# Patient Record
Sex: Female | Born: 1935 | Race: White | Hispanic: No | Marital: Married | Smoking: Never smoker
Health system: Southern US, Community
[De-identification: ages and names within clinical notes are randomized; demographics above are authoritative.]

## PROBLEM LIST (undated history)

## (undated) DIAGNOSIS — IMO0002 Reserved for concepts with insufficient information to code with codable children: Secondary | ICD-10-CM

## (undated) DIAGNOSIS — G43909 Migraine, unspecified, not intractable, without status migrainosus: Secondary | ICD-10-CM

## (undated) DIAGNOSIS — M87059 Idiopathic aseptic necrosis of unspecified femur: Secondary | ICD-10-CM

## (undated) DIAGNOSIS — N179 Acute kidney failure, unspecified: Secondary | ICD-10-CM

## (undated) DIAGNOSIS — I10 Essential (primary) hypertension: Secondary | ICD-10-CM

## (undated) DIAGNOSIS — M1611 Unilateral primary osteoarthritis, right hip: Secondary | ICD-10-CM

## (undated) DIAGNOSIS — M6282 Rhabdomyolysis: Secondary | ICD-10-CM

## (undated) DIAGNOSIS — E7800 Pure hypercholesterolemia, unspecified: Secondary | ICD-10-CM

## (undated) DIAGNOSIS — R269 Unspecified abnormalities of gait and mobility: Secondary | ICD-10-CM

## (undated) DIAGNOSIS — I509 Heart failure, unspecified: Secondary | ICD-10-CM

## (undated) DIAGNOSIS — D696 Thrombocytopenia, unspecified: Secondary | ICD-10-CM

## (undated) DIAGNOSIS — N390 Urinary tract infection, site not specified: Secondary | ICD-10-CM

## (undated) HISTORY — PX: BACK SURGERY: SHX140

## (undated) HISTORY — PX: ABDOMINAL HYSTERECTOMY: SHX81

## (undated) HISTORY — DX: Idiopathic aseptic necrosis of unspecified femur: M87.059

## (undated) HISTORY — DX: Reserved for concepts with insufficient information to code with codable children: IMO0002

## (undated) HISTORY — DX: Unspecified abnormalities of gait and mobility: R26.9

## (undated) HISTORY — DX: Urinary tract infection, site not specified: N39.0

## (undated) HISTORY — DX: Heart failure, unspecified: I50.9

## (undated) HISTORY — PX: VULVECTOMY: SHX1086

## (undated) HISTORY — PX: OTHER SURGICAL HISTORY: SHX169

## (undated) HISTORY — DX: Migraine, unspecified, not intractable, without status migrainosus: G43.909

## (undated) HISTORY — PX: CHOLECYSTECTOMY: SHX55

---

## 1997-10-06 ENCOUNTER — Ambulatory Visit (HOSPITAL_COMMUNITY): Admission: RE | Admit: 1997-10-06 | Discharge: 1997-10-06 | Payer: Self-pay | Admitting: Orthopedic Surgery

## 1997-10-28 ENCOUNTER — Inpatient Hospital Stay (HOSPITAL_COMMUNITY): Admission: RE | Admit: 1997-10-28 | Discharge: 1997-10-30 | Payer: Self-pay | Admitting: Orthopedic Surgery

## 1998-01-17 ENCOUNTER — Encounter: Payer: Self-pay | Admitting: Family Medicine

## 1998-01-17 ENCOUNTER — Ambulatory Visit (HOSPITAL_COMMUNITY): Admission: RE | Admit: 1998-01-17 | Discharge: 1998-01-17 | Payer: Self-pay | Admitting: Family Medicine

## 1998-11-17 ENCOUNTER — Emergency Department (HOSPITAL_COMMUNITY): Admission: EM | Admit: 1998-11-17 | Discharge: 1998-11-18 | Payer: Self-pay | Admitting: Emergency Medicine

## 1998-11-17 ENCOUNTER — Encounter: Payer: Self-pay | Admitting: Emergency Medicine

## 1998-12-31 ENCOUNTER — Encounter: Payer: Self-pay | Admitting: Specialist

## 1998-12-31 ENCOUNTER — Encounter: Admission: RE | Admit: 1998-12-31 | Discharge: 1998-12-31 | Payer: Self-pay | Admitting: Specialist

## 1999-02-15 ENCOUNTER — Encounter: Payer: Self-pay | Admitting: Family Medicine

## 1999-02-15 ENCOUNTER — Ambulatory Visit (HOSPITAL_COMMUNITY): Admission: RE | Admit: 1999-02-15 | Discharge: 1999-02-15 | Payer: Self-pay | Admitting: Family Medicine

## 2000-02-16 ENCOUNTER — Ambulatory Visit (HOSPITAL_COMMUNITY): Admission: RE | Admit: 2000-02-16 | Discharge: 2000-02-16 | Payer: Self-pay | Admitting: Family Medicine

## 2000-02-16 ENCOUNTER — Encounter: Payer: Self-pay | Admitting: Family Medicine

## 2001-10-30 ENCOUNTER — Encounter: Payer: Self-pay | Admitting: Family Medicine

## 2001-10-30 ENCOUNTER — Ambulatory Visit (HOSPITAL_COMMUNITY): Admission: RE | Admit: 2001-10-30 | Discharge: 2001-10-30 | Payer: Self-pay | Admitting: Family Medicine

## 2002-01-28 ENCOUNTER — Encounter: Payer: Self-pay | Admitting: Orthopedic Surgery

## 2002-02-03 ENCOUNTER — Encounter: Payer: Self-pay | Admitting: Orthopedic Surgery

## 2002-02-03 ENCOUNTER — Inpatient Hospital Stay (HOSPITAL_COMMUNITY): Admission: RE | Admit: 2002-02-03 | Discharge: 2002-02-06 | Payer: Self-pay | Admitting: Orthopedic Surgery

## 2002-02-06 ENCOUNTER — Inpatient Hospital Stay (HOSPITAL_COMMUNITY)
Admission: RE | Admit: 2002-02-06 | Discharge: 2002-02-16 | Payer: Self-pay | Admitting: Physical Medicine & Rehabilitation

## 2002-02-10 ENCOUNTER — Encounter: Payer: Self-pay | Admitting: Physical Medicine & Rehabilitation

## 2002-02-13 ENCOUNTER — Encounter: Payer: Self-pay | Admitting: Physical Medicine & Rehabilitation

## 2002-02-15 ENCOUNTER — Encounter: Payer: Self-pay | Admitting: Physical Medicine & Rehabilitation

## 2002-05-14 ENCOUNTER — Encounter: Payer: Self-pay | Admitting: Orthopedic Surgery

## 2002-05-20 ENCOUNTER — Inpatient Hospital Stay (HOSPITAL_COMMUNITY): Admission: RE | Admit: 2002-05-20 | Discharge: 2002-05-27 | Payer: Self-pay | Admitting: Orthopedic Surgery

## 2002-05-20 ENCOUNTER — Encounter: Payer: Self-pay | Admitting: Orthopedic Surgery

## 2002-05-27 ENCOUNTER — Inpatient Hospital Stay (HOSPITAL_COMMUNITY)
Admission: RE | Admit: 2002-05-27 | Discharge: 2002-06-05 | Payer: Self-pay | Admitting: Physical Medicine & Rehabilitation

## 2002-06-03 ENCOUNTER — Encounter: Payer: Self-pay | Admitting: Physical Medicine & Rehabilitation

## 2002-11-05 ENCOUNTER — Ambulatory Visit (HOSPITAL_COMMUNITY): Admission: RE | Admit: 2002-11-05 | Discharge: 2002-11-05 | Payer: Self-pay | Admitting: Obstetrics & Gynecology

## 2002-11-05 ENCOUNTER — Encounter: Payer: Self-pay | Admitting: Obstetrics & Gynecology

## 2003-01-18 ENCOUNTER — Encounter: Admission: RE | Admit: 2003-01-18 | Discharge: 2003-01-18 | Payer: Self-pay | Admitting: Cardiology

## 2003-01-25 ENCOUNTER — Ambulatory Visit (HOSPITAL_COMMUNITY): Admission: RE | Admit: 2003-01-25 | Discharge: 2003-01-26 | Payer: Self-pay | Admitting: Cardiology

## 2003-02-24 ENCOUNTER — Ambulatory Visit (HOSPITAL_COMMUNITY): Admission: RE | Admit: 2003-02-24 | Discharge: 2003-02-24 | Payer: Self-pay | Admitting: Cardiology

## 2003-03-15 ENCOUNTER — Ambulatory Visit (HOSPITAL_COMMUNITY): Admission: RE | Admit: 2003-03-15 | Discharge: 2003-03-15 | Payer: Self-pay | Admitting: Pulmonary Disease

## 2003-11-11 ENCOUNTER — Ambulatory Visit (HOSPITAL_COMMUNITY): Admission: RE | Admit: 2003-11-11 | Discharge: 2003-11-11 | Payer: Self-pay | Admitting: Family Medicine

## 2003-12-30 ENCOUNTER — Other Ambulatory Visit: Admission: RE | Admit: 2003-12-30 | Discharge: 2003-12-30 | Payer: Self-pay | Admitting: Obstetrics and Gynecology

## 2004-04-14 ENCOUNTER — Ambulatory Visit (HOSPITAL_COMMUNITY): Admission: RE | Admit: 2004-04-14 | Discharge: 2004-04-14 | Payer: Self-pay | Admitting: Gastroenterology

## 2004-04-14 IMAGING — CR DG BE W/ CM - WO/W KUB
8 of 10 series · 8 of 10 positions shown · non-contrast
Comparison: none

CLINICAL DATA: Change in bowel habits; family history of   carcinoma.  Incomplete colonoscopy.
 BARIUM ENEMA:
 With the aide of fluoroscopic visualization, barium was passed retrogradely throughout the colon with reflux of the normal terminal ileum.   The cecum appeared normal as did the remainder of the bowel.   There were no constricting defects or large filling defects.   There was a moderate amount of gas in the bowel from previous colonoscopic exam.   
 Post evacuation study showed good evacuation of the colon and revealed normal mucosal pattern throughout the colon.

[view not recorded (1 of 8)]
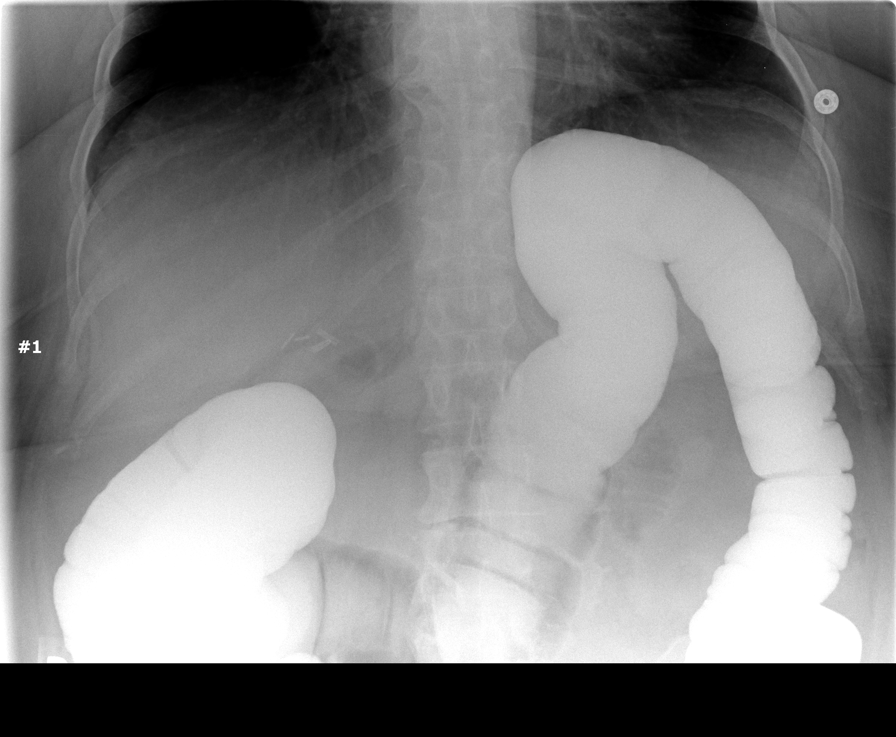

[view not recorded (2 of 8)]
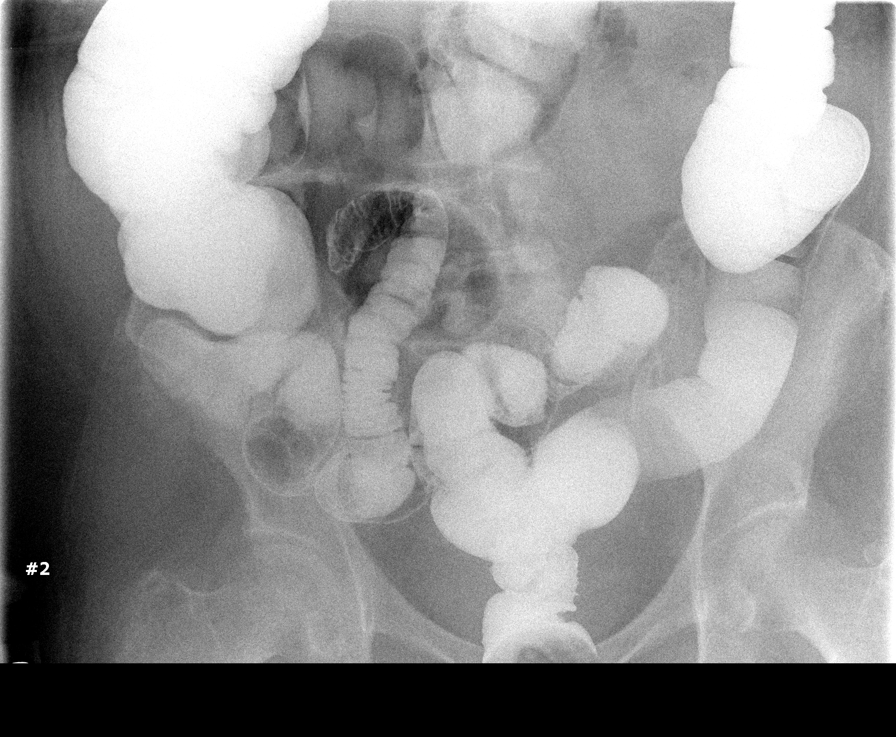

[view not recorded (3 of 8)]
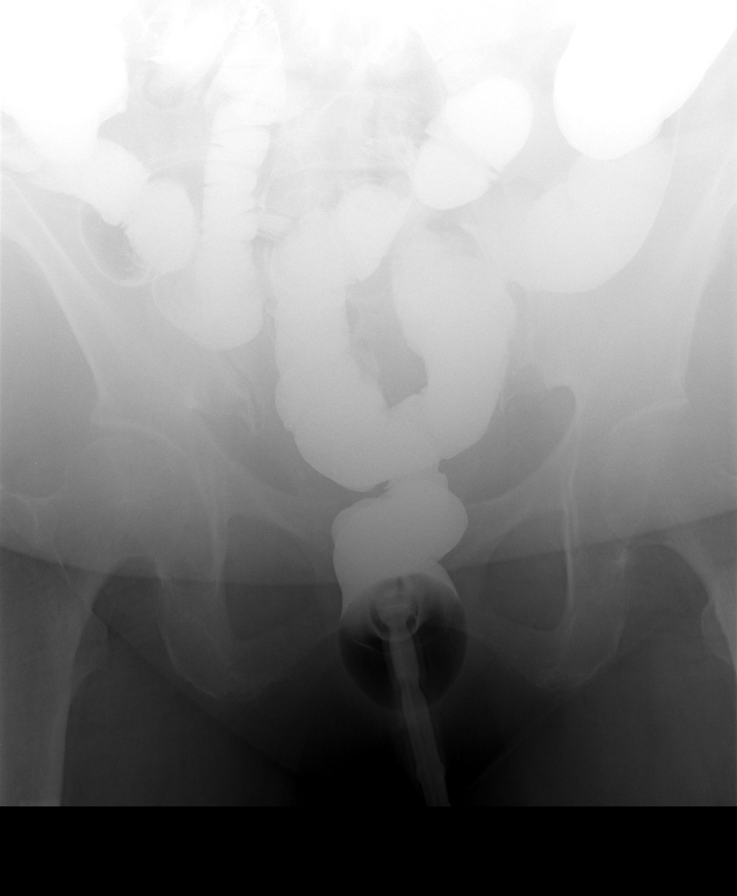

[view not recorded (4 of 8)]
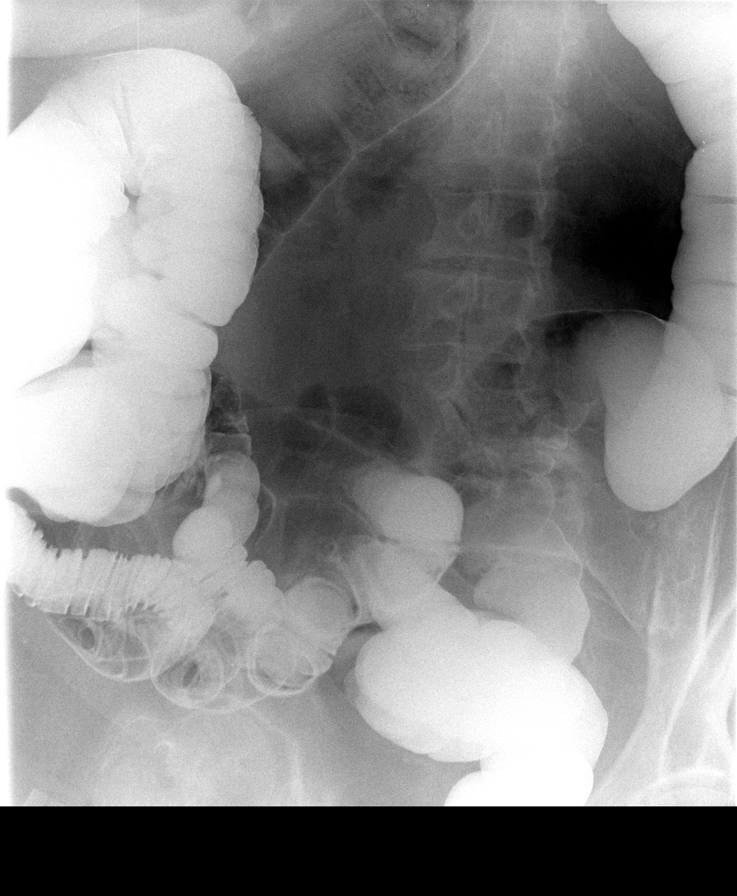

[view not recorded (5 of 8)]
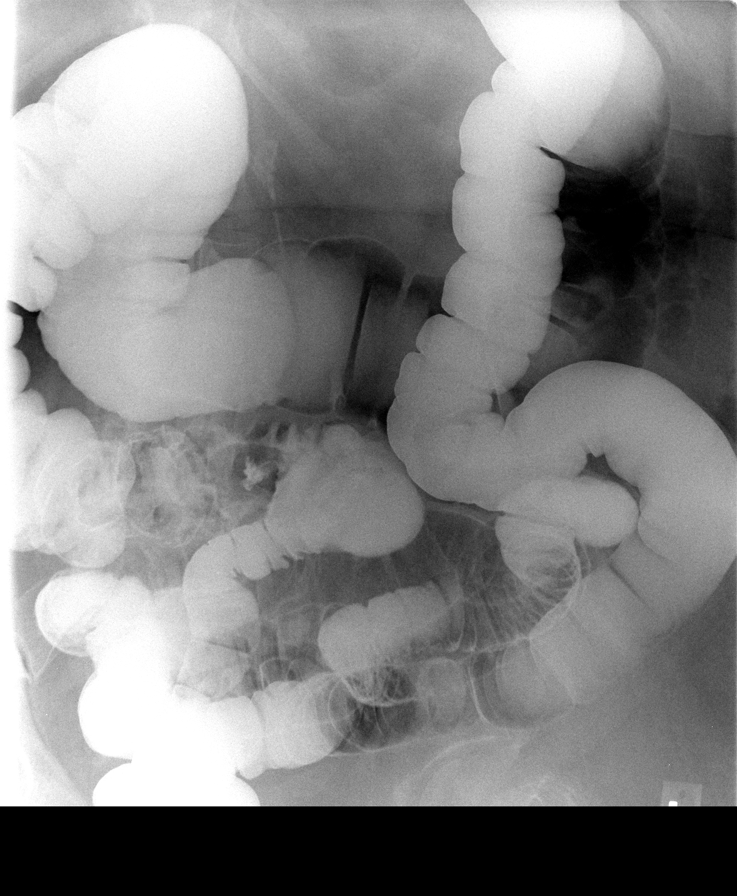

[view not recorded (6 of 8)]
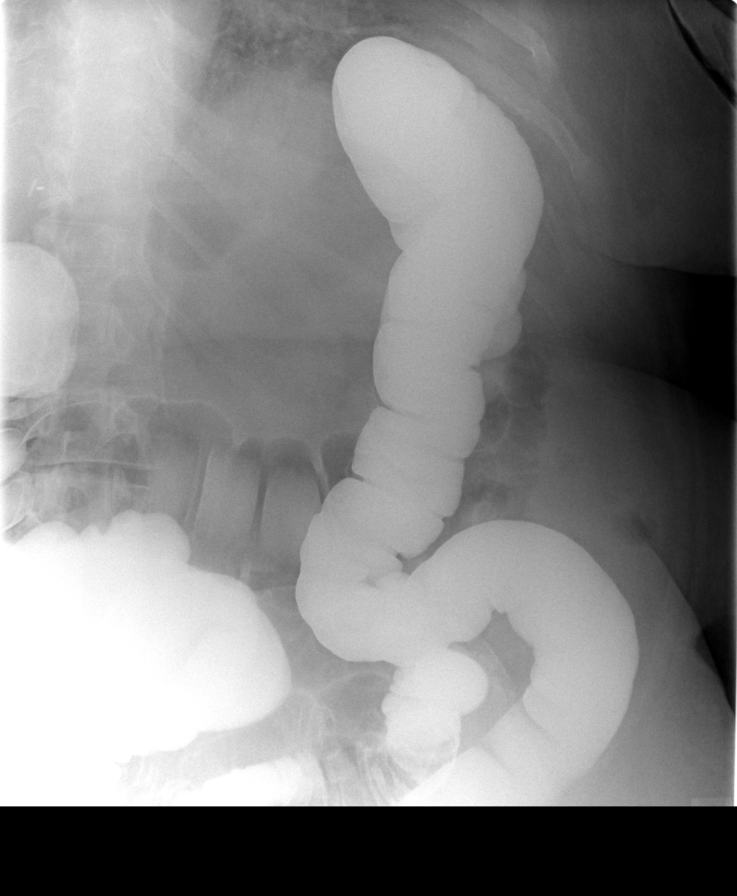

[view not recorded (7 of 8)]
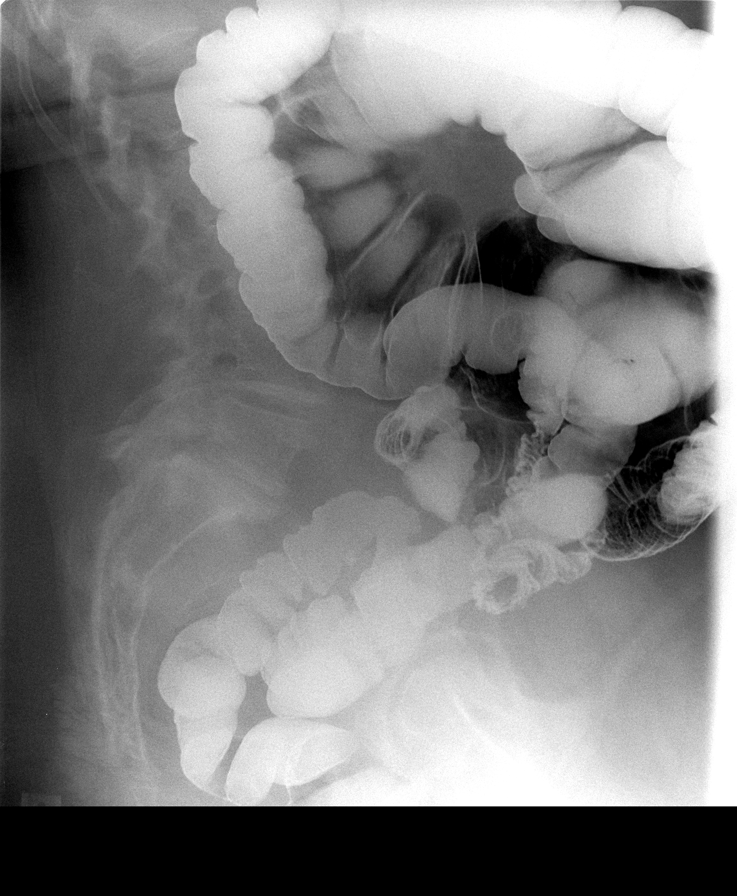

[view not recorded (8 of 8)]
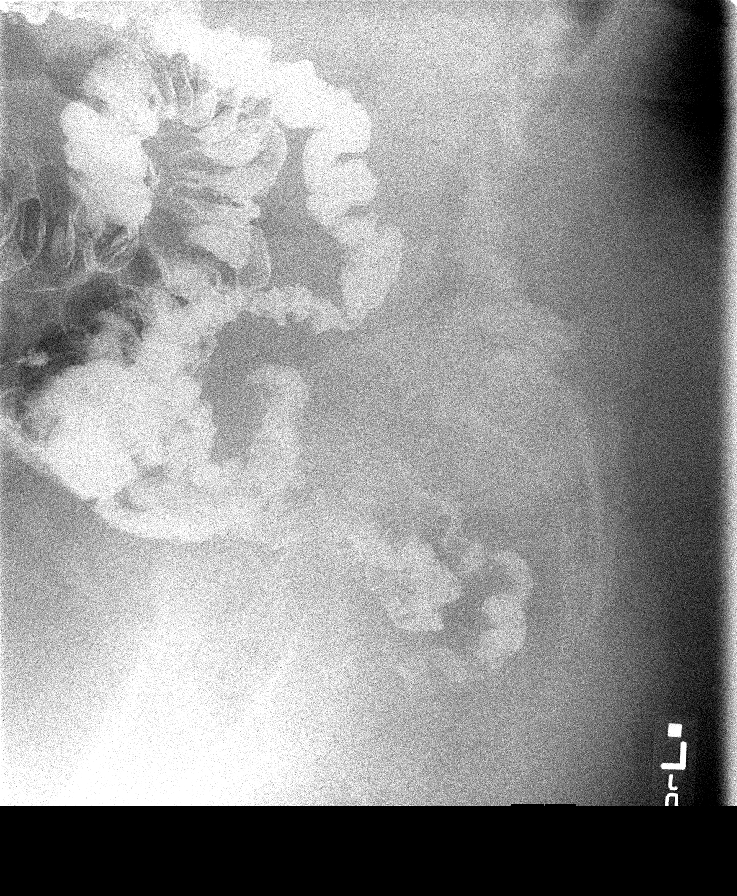

[8 of 10 positions shown; findings below may reference images not displayed]

IMPRESSION: Normal barium enema with reflux of the terminal ileum.

## 2004-12-21 ENCOUNTER — Ambulatory Visit (HOSPITAL_COMMUNITY): Admission: RE | Admit: 2004-12-21 | Discharge: 2004-12-21 | Payer: Self-pay | Admitting: Family Medicine

## 2005-05-18 ENCOUNTER — Encounter: Admission: RE | Admit: 2005-05-18 | Discharge: 2005-05-18 | Payer: Self-pay | Admitting: Orthopedic Surgery

## 2010-08-15 ENCOUNTER — Emergency Department (HOSPITAL_COMMUNITY): Payer: Medicare Other

## 2010-08-15 ENCOUNTER — Inpatient Hospital Stay (HOSPITAL_COMMUNITY)
Admission: EM | Admit: 2010-08-15 | Discharge: 2010-08-23 | DRG: 312 | Disposition: A | Discharge status: Skilled Nursing Facility | Payer: Medicare Other | Attending: Hospitalist | Admitting: Hospitalist

## 2010-08-15 DIAGNOSIS — E785 Hyperlipidemia, unspecified: Secondary | ICD-10-CM | POA: Diagnosis present

## 2010-08-15 DIAGNOSIS — M6282 Rhabdomyolysis: Secondary | ICD-10-CM

## 2010-08-15 DIAGNOSIS — M87059 Idiopathic aseptic necrosis of unspecified femur: Secondary | ICD-10-CM | POA: Diagnosis present

## 2010-08-15 DIAGNOSIS — M1610 Unilateral primary osteoarthritis, unspecified hip: Secondary | ICD-10-CM | POA: Diagnosis present

## 2010-08-15 DIAGNOSIS — I10 Essential (primary) hypertension: Secondary | ICD-10-CM | POA: Diagnosis present

## 2010-08-15 DIAGNOSIS — D696 Thrombocytopenia, unspecified: Secondary | ICD-10-CM | POA: Diagnosis present

## 2010-08-15 DIAGNOSIS — Z79899 Other long term (current) drug therapy: Secondary | ICD-10-CM

## 2010-08-15 DIAGNOSIS — T4275XA Adverse effect of unspecified antiepileptic and sedative-hypnotic drugs, initial encounter: Secondary | ICD-10-CM | POA: Diagnosis not present

## 2010-08-15 DIAGNOSIS — Z7982 Long term (current) use of aspirin: Secondary | ICD-10-CM

## 2010-08-15 DIAGNOSIS — E8779 Other fluid overload: Secondary | ICD-10-CM | POA: Diagnosis not present

## 2010-08-15 DIAGNOSIS — J45909 Unspecified asthma, uncomplicated: Secondary | ICD-10-CM | POA: Diagnosis present

## 2010-08-15 DIAGNOSIS — Z87440 Personal history of urinary (tract) infections: Secondary | ICD-10-CM

## 2010-08-15 DIAGNOSIS — K5909 Other constipation: Secondary | ICD-10-CM | POA: Diagnosis not present

## 2010-08-15 DIAGNOSIS — N390 Urinary tract infection, site not specified: Secondary | ICD-10-CM | POA: Diagnosis present

## 2010-08-15 DIAGNOSIS — R5381 Other malaise: Secondary | ICD-10-CM | POA: Diagnosis present

## 2010-08-15 DIAGNOSIS — E119 Type 2 diabetes mellitus without complications: Secondary | ICD-10-CM | POA: Diagnosis present

## 2010-08-15 DIAGNOSIS — R55 Syncope and collapse: Principal | ICD-10-CM | POA: Diagnosis present

## 2010-08-15 DIAGNOSIS — M169 Osteoarthritis of hip, unspecified: Secondary | ICD-10-CM | POA: Diagnosis present

## 2010-08-15 DIAGNOSIS — N179 Acute kidney failure, unspecified: Secondary | ICD-10-CM | POA: Diagnosis present

## 2010-08-15 HISTORY — DX: Rhabdomyolysis: M62.82

## 2010-08-15 LAB — POCT I-STAT, CHEM 8
Chloride: 112 mEq/L (ref 96–112)
Glucose, Bld: 155 mg/dL — ABNORMAL HIGH (ref 70–99)
HCT: 42 % (ref 36.0–46.0)
Hemoglobin: 14.3 g/dL (ref 12.0–15.0)
Potassium: 4 mEq/L (ref 3.5–5.1)
Sodium: 136 mEq/L (ref 135–145)

## 2010-08-15 LAB — URINALYSIS, ROUTINE W REFLEX MICROSCOPIC
Bilirubin Urine: NEGATIVE
Glucose, UA: NEGATIVE mg/dL
Protein, ur: NEGATIVE mg/dL
Urobilinogen, UA: 0.2 mg/dL (ref 0.0–1.0)

## 2010-08-15 LAB — DIFFERENTIAL
Basophils Absolute: 0 10*3/uL (ref 0.0–0.1)
Eosinophils Absolute: 0 10*3/uL (ref 0.0–0.7)
Eosinophils Relative: 0 % (ref 0–5)
Lymphs Abs: 0.5 10*3/uL — ABNORMAL LOW (ref 0.7–4.0)
Monocytes Absolute: 0.4 10*3/uL (ref 0.1–1.0)
Neutrophils Relative %: 90 % — ABNORMAL HIGH (ref 43–77)

## 2010-08-15 LAB — APTT: aPTT: 28 seconds (ref 24–37)

## 2010-08-15 LAB — CBC
HCT: 38.8 % (ref 36.0–46.0)
MCHC: 33.5 g/dL (ref 30.0–36.0)
Platelets: 132 10*3/uL — ABNORMAL LOW (ref 150–400)
RDW: 12.2 % (ref 11.5–15.5)
WBC: 8.9 10*3/uL (ref 4.0–10.5)

## 2010-08-15 LAB — URINE MICROSCOPIC-ADD ON

## 2010-08-15 LAB — CK TOTAL AND CKMB (NOT AT ARMC)
CK, MB: 14.1 ng/mL (ref 0.3–4.0)
Relative Index: 1.6 (ref 0.0–2.5)
Total CK: 870 U/L — ABNORMAL HIGH (ref 7–177)

## 2010-08-15 LAB — SAMPLE TO BLOOD BANK

## 2010-08-15 LAB — PROTIME-INR
INR: 1.07 (ref 0.00–1.49)
Prothrombin Time: 14.1 seconds (ref 11.6–15.2)

## 2010-08-16 DIAGNOSIS — I059 Rheumatic mitral valve disease, unspecified: Secondary | ICD-10-CM

## 2010-08-16 LAB — GLUCOSE, CAPILLARY
Glucose-Capillary: 165 mg/dL — ABNORMAL HIGH (ref 70–99)
Glucose-Capillary: 116 mg/dL — ABNORMAL HIGH (ref 70–99)
Glucose-Capillary: 108 mg/dL — ABNORMAL HIGH (ref 70–99)

## 2010-08-16 LAB — URINE CULTURE: Culture  Setup Time: 201206191627

## 2010-08-16 LAB — DIFFERENTIAL
Basophils Relative: 0 % (ref 0–1)
Eosinophils Absolute: 0 10*3/uL (ref 0.0–0.7)
Eosinophils Relative: 0 % (ref 0–5)
Lymphs Abs: 0.8 10*3/uL (ref 0.7–4.0)
Monocytes Relative: 9 % (ref 3–12)

## 2010-08-16 LAB — COMPREHENSIVE METABOLIC PANEL
AST: 64 U/L — ABNORMAL HIGH (ref 0–37)
Albumin: 3.1 g/dL — ABNORMAL LOW (ref 3.5–5.2)
Calcium: 8.8 mg/dL (ref 8.4–10.5)
Creatinine, Ser: 1.28 mg/dL — ABNORMAL HIGH (ref 0.50–1.10)
Sodium: 136 mEq/L (ref 135–145)
Total Protein: 5.6 g/dL — ABNORMAL LOW (ref 6.0–8.3)

## 2010-08-16 LAB — CBC
MCH: 33.3 pg (ref 26.0–34.0)
MCV: 98.5 fL (ref 78.0–100.0)
Platelets: 137 10*3/uL — ABNORMAL LOW (ref 150–400)
RDW: 12.3 % (ref 11.5–15.5)
WBC: 5.9 10*3/uL (ref 4.0–10.5)

## 2010-08-16 LAB — CARDIAC PANEL(CRET KIN+CKTOT+MB+TROPI)
CK, MB: 28.1 ng/mL (ref 0.3–4.0)
Total CK: 3023 U/L — ABNORMAL HIGH (ref 7–177)
Troponin I: <0.3 ng/mL (ref <0.30)

## 2010-08-17 LAB — COMPREHENSIVE METABOLIC PANEL
ALT: 26 U/L (ref 0–35)
Albumin: 2.9 g/dL — ABNORMAL LOW (ref 3.5–5.2)
Alkaline Phosphatase: 71 U/L (ref 39–117)
Chloride: 112 mEq/L (ref 96–112)
Glucose, Bld: 85 mg/dL (ref 70–99)
Potassium: 4.2 mEq/L (ref 3.5–5.1)
Sodium: 141 mEq/L (ref 135–145)
Total Bilirubin: 0.2 mg/dL — ABNORMAL LOW (ref 0.3–1.2)
Total Protein: 5.3 g/dL — ABNORMAL LOW (ref 6.0–8.3)

## 2010-08-17 LAB — GLUCOSE, CAPILLARY: Glucose-Capillary: 107 mg/dL — ABNORMAL HIGH (ref 70–99)

## 2010-08-17 LAB — CARDIAC PANEL(CRET KIN+CKTOT+MB+TROPI)
Relative Index: 0.8 (ref 0.0–2.5)
Troponin I: 0.3 ng/mL (ref <0.30)

## 2010-08-18 LAB — CBC
HCT: 33.4 % — ABNORMAL LOW (ref 36.0–46.0)
Hemoglobin: 11.3 g/dL — ABNORMAL LOW (ref 12.0–15.0)
RBC: 3.32 MIL/uL — ABNORMAL LOW (ref 3.87–5.11)

## 2010-08-18 LAB — COMPREHENSIVE METABOLIC PANEL
ALT: 28 U/L (ref 0–35)
AST: 49 U/L — ABNORMAL HIGH (ref 0–37)
Alkaline Phosphatase: 70 U/L (ref 39–117)
CO2: 19 mEq/L (ref 19–32)
Chloride: 113 mEq/L — ABNORMAL HIGH (ref 96–112)
GFR calc non Af Amer: 55 mL/min — ABNORMAL LOW (ref >60)
Glucose, Bld: 88 mg/dL (ref 70–99)
Potassium: 4.7 mEq/L (ref 3.5–5.1)
Sodium: 139 mEq/L (ref 135–145)
Total Bilirubin: 0.2 mg/dL — ABNORMAL LOW (ref 0.3–1.2)

## 2010-08-18 LAB — GLUCOSE, CAPILLARY
Glucose-Capillary: 85 mg/dL (ref 70–99)
Glucose-Capillary: 111 mg/dL — ABNORMAL HIGH (ref 70–99)

## 2010-08-19 LAB — CK: Total CK: 852 U/L — ABNORMAL HIGH (ref 7–177)

## 2010-08-19 LAB — GLUCOSE, CAPILLARY: Glucose-Capillary: 95 mg/dL (ref 70–99)

## 2010-08-20 LAB — BASIC METABOLIC PANEL
Calcium: 9.6 mg/dL (ref 8.4–10.5)
GFR calc Af Amer: >60 mL/min (ref >60)
GFR calc non Af Amer: >60 mL/min (ref >60)
Glucose, Bld: 91 mg/dL (ref 70–99)
Sodium: 141 mEq/L (ref 135–145)

## 2010-08-20 LAB — CBC
MCV: 97.6 fL (ref 78.0–100.0)
Platelets: 129 10*3/uL — ABNORMAL LOW (ref 150–400)
RDW: 12.4 % (ref 11.5–15.5)
WBC: 4.6 10*3/uL (ref 4.0–10.5)

## 2010-08-21 LAB — BASIC METABOLIC PANEL
CO2: 23 mEq/L (ref 19–32)
Chloride: 105 mEq/L (ref 96–112)
GFR calc Af Amer: >60 mL/min (ref >60)
Potassium: 4.3 mEq/L (ref 3.5–5.1)
Sodium: 139 mEq/L (ref 135–145)

## 2010-08-21 LAB — MAGNESIUM: Magnesium: 1.7 mg/dL (ref 1.5–2.5)

## 2010-08-21 LAB — CBC
Hemoglobin: 11.8 g/dL — ABNORMAL LOW (ref 12.0–15.0)
MCH: 33.3 pg (ref 26.0–34.0)
Platelets: DECREASED 10*3/uL (ref 150–400)
RBC: 3.54 MIL/uL — ABNORMAL LOW (ref 3.87–5.11)

## 2010-08-21 LAB — PHOSPHORUS: Phosphorus: 4.3 mg/dL (ref 2.3–4.6)

## 2010-08-22 LAB — GLUCOSE, CAPILLARY
Glucose-Capillary: 95 mg/dL (ref 70–99)
Glucose-Capillary: 105 mg/dL — ABNORMAL HIGH (ref 70–99)
Glucose-Capillary: 110 mg/dL — ABNORMAL HIGH (ref 70–99)
Glucose-Capillary: 104 mg/dL — ABNORMAL HIGH (ref 70–99)

## 2010-08-22 LAB — CBC
HCT: 34.7 % — ABNORMAL LOW (ref 36.0–46.0)
Hemoglobin: 11.4 g/dL — ABNORMAL LOW (ref 12.0–15.0)
MCHC: 32.9 g/dL (ref 30.0–36.0)
RDW: 12.5 % (ref 11.5–15.5)
WBC: 4.7 10*3/uL (ref 4.0–10.5)

## 2010-08-23 LAB — GLUCOSE, CAPILLARY: Glucose-Capillary: 106 mg/dL — ABNORMAL HIGH (ref 70–99)

## 2010-09-01 NOTE — Discharge Summary (Signed)
Penny Delgado, HURLBUTT NO.:  [id number]  MEDICAL RECORD NO.:  [id number]  LOCATION:  4705                         FACILITY:  [facility]  PHYSICIAN:  Rosanna Randy, MDDATE OF BIRTH:  1934/03/02  DATE OF ADMISSION:  08/15/2010 DATE OF DISCHARGE:  08/22/2010                              DISCHARGE SUMMARY   PRIMARY CARE PHYSICIAN:  Teena Irani. Arlyce Dice, M.D.  DISCHARGE DIAGNOSES: 1. Syncope episode secondary to vasovagal event. 2. Rhabdomyolysis. 3. Acute renal failure secondary to rhabdomyolysis. 4. Hypertension. 5. Diabetes. 6. Asthma. 7. History of chronic urinary tract infection with an acute urinary     infection during this hospitalization. 8. Hypercholesterolemia. 9. Thrombocytopenia. 10.Fluid overload secondary to aggressive fluid resuscitation for the     patient rhabdomyolysis.  No signs of congestive heart failure. 11.Constipation secondary to narcotics. 12.Physical deconditioning. 13.Diabetes mellitus type 2, currently controlled with diet. 14.Osteoarthritis and also right hip avascular necrosis.  MEDICATIONS:  The patient had been discharged on, 1. Vicodin 1 tablet by mouth every 8 hours as needed for severe pain. 2. Senokot 2 tablets by mouth daily as needed for constipation. 3. Simvastatin 20 mg by mouth daily. 4. Ultram 50 mg 1 tablet by mouth every 6 hours as needed for pain. 5. Aspirin 81 mg 1 tablet by mouth daily. 6. Benicar 40 mg 1 tablet by mouth daily. 7. Singulair 10 mg 1 tablet by mouth daily.  DISPOSITION AND FOLLOWUP:  The patient had been discharged in stable and improved condition at this moment without any further episodes of syncope, near-syncope, chest pain, shortness of breath, abdominal pain or any other complaints.  Since the patient is still having significant physical deconditioning and generalized weakness after receiving recommendation for physical therapy, the decision is for the patient to being transferred for  further physical rehab short-term to a skilled nurse facility.  The patient has chosen to go to [facility] and they had a bed available.  Now that she is medically stable, we are going to transfer her over there.  Once the patient is discharged from the nursing home, is going to be important that she had a follow-up with primary care physician to look over her chronic medical conditions and to adjust her medications as needed.  PROCEDURES PERFORMED DURING THIS HOSPITALIZATION:  The patient has a chest x-ray that demonstrated cardiomegaly without any active lung disease.  She had right hip two views x-ray that demonstrated progression of degenerative changes involving the right hip with apparent changes of avascular necrosis which are unchanged from MRI done in March 2007.  The patient also had a CT of the head without contrast that demonstrated no acute intracranial abnormalities and a CT of the cervical spine demonstrated no acute fractures or subluxation and degenerative changes.  No other procedures were performed during this hospitalization.  No consultations were made during this admission other than the physical therapy, occupational therapy.  For full history of present illness, physical exam, and laboratory data on admission please refer to dictation done by Dr. Dennard Nip on August 15, 2010.  Briefly, Penny Delgado is a 76 year old female with a history of diabetes and hypertension who presents after a syncopal episode at  her home.  The patient reports that this morning she had been trying to go to the restroom several times without success.  She went in to her bathroom in order to take a bath and she passed out.  She denies remembering anything from prior to passing out.  When she awoke, she was unable to get herself up the floor.  She reports be clear headed at that time, but just too weak and in too much pain in order to stand.  She noted that she laid on the bathroom  floor for about 2-1/2 hours prior to anyone arriving to help.  She had no prior syncopal episodes.  The patient denies any current chest pain, shortness of breath.  She reports frequent urinary tract infection for which she is not prophylactic antibiotics at this point.  Her main complaint now is severe pain in her right hip.  This has been an ongoing issue, but may have been exacerbated by her fall.  Normally, she is able to perform all of her activities of daily living at home on her own.  She had only a small amount of help needed for cooking and preparing foods.  LABORATORY DATA:  Pertinent laboratory data throughout this hospitalization includes PT 14.1, INR 1.07, PTT 28.  CBC on admission with a white blood cells of 8.9, hemoglobin 13, platelets 132.  Troponin and cardiac markers were negative x3.  A urinalysis demonstrated moderate leukocytes with negative nitrite.  Microscopy, 7-10 white blood cells.  Urine cultures without any growth.  The patient had subsequent CK totals done throughout this hospitalization after we found on admission a CK total of 870 which was elevated and most likely secondary to her rhabdomyolysis.  At discharge, the patient's CK total level was in the 200 range after being continuously going down and improving.  She also had a BMET at discharge that demonstrated a sodium of 139, potassium 4.3, chloride 105, bicarb 23, glucose 98, BUN 19, creatinine 0.90.  Please take note that on admission, the patient had an i-STAT chemistry that demonstrated a creatinine of 1.40.  The patient had a magnesium of 1.7, a phosphorus of 4.3, and a CBC with differential that showed white blood cells of 4.7, hemoglobin of 11.4, and platelets of 151 at the moment of discharge.  HOSPITAL COURSE BY PROBLEM: 1. The patient's syncope episode most likely secondary to a vasovagal     event had been pretty much to whole workup negative with     contributions of urinary tract  infection.  At this moment, no     further workup is required and the patient is going to be     transferred to a skilled nurse facility in order to work on her     physical rehab and for her to regain strength. 2. The patient rhabdomyolysis is pretty much resolved at the moment of     discharge.  We had just suggested for the patient to continue     drinking plenty of fluids to keep herself hydrated and she is going     to follow by the physician at the nursing home and we recommend     another BMET in about a week or 2 to make sure that the kidneys     continued to be okay and also CK total level. 3. Acute renal failure secondary to rhabdomyolysis in a patient using     Benicar and also on Bactrim.  After aggressive fluid resuscitation  and discontinuation of Benicar and also Bactrim during this     admission, the patient's acute renal failure resolved and her     creatinine is back to normal.  She just needs to have further     followup as an outpatient with primary care physician and in about     1 or 2 weeks BMET for followup with physician at the nursing home. 4. UTI.  There was no growth of the culture and the patient has     complete suppressive therapy with Bactrim.  At this point, the     patient had been discharged without any further antibiotics since     she is not having any symptomatic signs of UTI. 5. Development of pulmonary vascular congestion due to aggressive     fluid resuscitation that resolved with the use of Lasix.  At the     moment of discharge, she is not having any difficulty breathing and     her lungs are completely clear.  She received three doses of Lasix     throughout this whole hospitalization in order to clear out this     extra fluids. 6. Low bicarb that the patient had all secondary to the acute renal     failure that came with the rhabdomyolysis.  Once the fluid was     given, the patient's bicarb returned back to normal. 7. Thrombocytopenia  seen throughout this hospitalization was     attributed to demargination initially with fluid resuscitation     later causing mild hemodilution at discharge.  Once the patient is     euvolemic, the patient's platelets are in the normal range. 8. Osteoarthritis.  Plan is to continue using her chronic medication     for pain with Vicodin and also tramadol and to follow with primary     care physician as an outpatient. 9. Physical deconditioning.  After having assessment by Physical     Therapy, the plan is for the patient to be transferred to a skilled     nurse facility for physical rehab. 10.Hypertension.  We are going to continue using Benicar, now that her     BMETs is demonstrating a normal renal function and she is going to     follow with another BMET in about 2 weeks at the nursing home with     further adjustment of her medications by primary care physician as     an outpatient. 11.Diabetes, currently just diet controlled.  We are going to continue     with a low-carbohydrate diet. 12.Hyperlipidemia.  She is going to continue using Zocor 20 mg by     mouth daily and is going to follow as an outpatient for further     adjustment of her medication.  She had been instructed to follow a     low-fat diet. 13.History of asthma.  We are going to continue using Singulair. 14.Constipation secondary to the use of narcotics.  We are going to     continue using Senokot as needed.  DISCHARGE PHYSICAL EXAMINATION:  VITAL SIGNS:  At discharge, temperature of 98.4, heart rate 80, respiratory rate 22, blood pressure 150/72, oxygen saturation 96% on room air. GENERAL:  She was in no acute distress complaining of mild-to-moderate pain on her right hip and right leg. RESPIRATORY SYSTEM:  Clear to auscultation bilaterally. CARDIOVASCULAR:  Regular rate and rhythm.  No murmurs, gallops, or rubs. ABDOMEN:  Soft, nontender, nondistended with positive bowel sounds. EXTREMITIES:  Without any  edema. NEUROLOGIC:  Without any focal deficit.     Rosanna Randy, MD     CEM/MEDQ  D:  08/22/2010  T:  08/22/2010  Job:  [id number]  cc:   Teena Irani. Arlyce Dice, M.D.  Electronically Signed by Vassie Loll MD on 09/01/2010 02:01:11 PM

## 2010-12-27 NOTE — H&P (Signed)
Penny Delgado, Penny Delgado NO.:  [id number]  MEDICAL RECORD NO.:  [id number]  LOCATION:  [facility]                         FACILITY:  [facility]  PHYSICIAN:  Letha Cape, MD         DATE OF BIRTH:  [birth year]  DATE OF ADMISSION:  08/15/2010 DATE OF DISCHARGE:                             HISTORY & PHYSICAL   HISTORY OF PRESENT ILLNESS:  A 76 year old female with history of diabetes and hypertension who presents after a syncopal episode at her home.  The patient reports that this morning, she had been trying to go to the restroom several times without success.  She went in to her bathroom in order to take a bath and she passed out.  She denies remembering anything from prior to passing out.  When she awoke, she was unable to get herself off the floor.  She reports being clear-headed at that time, but just too weak and in too much pain in order to stand. She believes she laid on the bathroom floor for about 2-1/2 hours prior to anyone arriving to help.  She has had no prior syncopal episodes. She denies any current chest pain or shortness of breath.  She reports frequent urinary tract infections for which she is on prophylactic antibiotics.  Her main complaint now is severe pain in her right hip. This has been an ongoing issue, but may have been exacerbated by her fall.  Normally, she is able to perform all of her ADLs at home on her own with only small amount of help needed for cooking and preparing foods.  PAST MEDICAL HISTORY: 1. Hypertension. 2. Diabetes. 3. Asthma. 4. Chronic UTI. 5. Hypercholesterol. 6. Hyperlipidemia.  ALLERGIES:  No known drug allergies.  MEDICATIONS:  The patient does not remember dosages of any of her medications, this records is from the ER.  Actos, Benicar, Percocet, Singulair, sulfadiazine, Tylenol, and Zetia.  PAST SURGICAL HISTORY:  Bilateral knee surgeries.  SOCIAL HISTORY:  She is married and lives at home with her spouse.  She does  not smoke or drink.  FAMILY HISTORY:  Noncontributory.  REVIEW OF SYSTEMS:  All systems reviewed were negative except as mentioned in the HPI.  PHYSICAL EXAMINATION:  VITAL SIGNS:  Temperature 97.9, blood pressure 120/96, heart rate 95, respirations 18, and sats 100% on room air. GENERAL:  This is an obese elderly female in no acute distress. HEENT:  Normocephalic, bruising underneath both eyes and across the bridge of the nose, moist mucous membranes. CARDIOVASCULAR:  Regular rate and rhythm.  No murmurs, rubs, or gallops. LUNGS:  Clear to auscultation bilaterally. ABDOMEN:  Obese, soft, nondistended, not nontender. EXTREMITIES:  No cyanosis, clubbing, or edema.  LABORATORY DATA:  White count 8.9, hemoglobin 13, hematocrit 38.8, platelets 132, and neutrophils 90%.  PT 14.1, INR 1.07, and PTT 28. Sodium 136, potassium 4, chloride 112, glucose 155, BUN 32, and creatinine 1.4.  CK 870, CK-MB 14.1, and troponin 0.3.  UA specific gravity 1.018, pH 5.5, ketones 15, blood large amount, leukocytes and moderate squamous cells few, nitrite negative, wbc's 7-10, bacteria rare.  RADIOLOGY DATA:  CT of the head and C-spine shows no bony fractures or other  acute abnormalities.  Right hip x-ray shows degenerative changes involving the right hip with avascular necrosis.  No acute fracture.  X- ray of the chest shows cardiomegaly with no active lung disease.  ASSESSMENT/PLAN:  A 76 year old female with history of hypertension, hyperlipidemia, and diabetes who presents following a syncopal episode now with rhabdomyolysis.  1. Syncope.  We will monitor on telemetry overnight.  We will get an     echocardiogram.  This appears to be vasovagal in nature, but we     will rule out cardiac arrhythmias or valvular disorders as possible     causes. 2. Rhabdomyolysis.  We will treat with IV fluids and monitor CK levels     as well as renal function. 3. Urinary tract infection.  We will treat with  antibiotics and follow     up culture results. 4. Hypertension.  Continue home medications once we discover the     doses. 5. Hyperlipidemia.  Continue home medications once the doses or     determined. 6. Lovenox for DVT prophylaxis.          ______________________________ Letha Cape, MD     RW/MEDQ  D:  08/15/2010  T:  08/15/2010  Job:  [id number]  Electronically Signed by Virginia Rochester M.D. on 12/27/2010 10:19:44 AM

## 2011-02-14 ENCOUNTER — Other Ambulatory Visit (HOSPITAL_COMMUNITY): Payer: Self-pay | Admitting: Cardiovascular Disease

## 2011-02-14 DIAGNOSIS — I2510 Atherosclerotic heart disease of native coronary artery without angina pectoris: Secondary | ICD-10-CM

## 2011-02-14 DIAGNOSIS — Z01818 Encounter for other preprocedural examination: Secondary | ICD-10-CM

## 2011-02-23 ENCOUNTER — Ambulatory Visit (HOSPITAL_COMMUNITY): Admission: RE | Admit: 2011-02-23 | Payer: 59 | Source: Ambulatory Visit

## 2011-02-23 ENCOUNTER — Ambulatory Visit (HOSPITAL_COMMUNITY): Payer: 59

## 2011-02-23 ENCOUNTER — Encounter (HOSPITAL_COMMUNITY)
Admission: RE | Admit: 2011-02-23 | Discharge: 2011-02-23 | Disposition: A | Discharge status: Home / Self Care | Payer: Medicare Other | Source: Ambulatory Visit | Attending: Cardiovascular Disease | Admitting: Cardiovascular Disease

## 2011-02-23 DIAGNOSIS — R110 Nausea: Secondary | ICD-10-CM | POA: Insufficient documentation

## 2011-02-23 DIAGNOSIS — I2510 Atherosclerotic heart disease of native coronary artery without angina pectoris: Secondary | ICD-10-CM

## 2011-02-23 DIAGNOSIS — Z01818 Encounter for other preprocedural examination: Secondary | ICD-10-CM

## 2011-02-23 DIAGNOSIS — Z01810 Encounter for preprocedural cardiovascular examination: Secondary | ICD-10-CM | POA: Insufficient documentation

## 2011-02-23 DIAGNOSIS — R0602 Shortness of breath: Secondary | ICD-10-CM | POA: Insufficient documentation

## 2011-02-23 DIAGNOSIS — R232 Flushing: Secondary | ICD-10-CM | POA: Insufficient documentation

## 2011-02-23 MED ORDER — TECHNETIUM TC 99M TETROFOSMIN IV KIT
30.0000 | PACK | Freq: Once | INTRAVENOUS | Status: AC | PRN
  Administered 2011-02-23: 30 via INTRAVENOUS

## 2011-02-23 MED ORDER — REGADENOSON 0.4 MG/5ML IV SOLN
INTRAVENOUS | Status: AC
  Administered 2011-02-23: 0.4 mg via INTRAVENOUS
  Filled 2011-02-23: qty 5

## 2011-02-23 MED ORDER — TECHNETIUM TC 99M TETROFOSMIN IV KIT
10.0000 | PACK | Freq: Once | INTRAVENOUS | Status: AC | PRN
  Administered 2011-02-23: 10 via INTRAVENOUS

## 2011-02-23 MED ORDER — AMINOPHYLLINE 25 MG/ML IV SOLN
80.0000 mg | Freq: Once | INTRAVENOUS | Status: AC
  Administered 2011-02-23: 80 mg via INTRAVENOUS
  Filled 2011-02-23: qty 3.2

## 2011-03-20 ENCOUNTER — Encounter (HOSPITAL_COMMUNITY): Payer: Self-pay | Admitting: *Deleted

## 2011-03-20 ENCOUNTER — Encounter (HOSPITAL_COMMUNITY): Payer: Self-pay | Admitting: Pharmacy Technician

## 2011-03-20 NOTE — Pre-Procedure Instructions (Addendum)
Stress test 02-23-2011 [facility] on chart Echo 08-16-2010 on chart ekg 02-01-2011 [facility] on chart Last office visit note 02-01-2011 [facility] on chart cardiac clearance note dr little on chart Spoke with pts husband grover Anacker and reviewed medical history and made aware arrive 1245 pm 04-03-2011 [facility]. Pre op instructions faxed to [phone], fax confirmation received and placed on chart  Spoke with betty warrick rn pre op instructions received and no questions about instructions Message left with wendy cayton, nursing home has no instructions  About stopping 81 mg aspirin

## 2011-03-27 NOTE — Pre-Procedure Instructions (Signed)
PT'S PREOP CBC WITH DIFF, PT, PTT, CMET AND URINALYSIS  WERE DONE BY [facility] ON 03/22/11--RESULTS WERE FAXED TO [facility] PRESURGICAL TESTING WITH NOTE THAT PT WAS STARTED ON SEPTRA DS FOR UTI.  COPIES OF PREOP LABS WERE FAXED TO DR. Jeannetta Ellis OFFICE WITH THE NOTE ABOUT PT STARTING ANTIBIOTIC.

## 2011-04-03 ENCOUNTER — Encounter (HOSPITAL_COMMUNITY): Payer: Self-pay | Admitting: Anesthesiology

## 2011-04-03 ENCOUNTER — Inpatient Hospital Stay (HOSPITAL_COMMUNITY)
Admission: RE | Admit: 2011-04-03 | Discharge: 2011-04-09 | DRG: 470 | Disposition: A | Discharge status: Skilled Nursing Facility | Payer: Medicare Other | Source: Ambulatory Visit | Attending: Orthopedic Surgery | Admitting: Orthopedic Surgery

## 2011-04-03 ENCOUNTER — Encounter (HOSPITAL_COMMUNITY): Payer: Self-pay | Admitting: *Deleted

## 2011-04-03 ENCOUNTER — Inpatient Hospital Stay (HOSPITAL_COMMUNITY): Payer: Medicare Other

## 2011-04-03 ENCOUNTER — Encounter (HOSPITAL_COMMUNITY)
Admission: RE | Disposition: A | Discharge status: Skilled Nursing Facility | Payer: Self-pay | Source: Ambulatory Visit | Attending: Orthopedic Surgery

## 2011-04-03 ENCOUNTER — Inpatient Hospital Stay (HOSPITAL_COMMUNITY): Payer: Medicare Other | Admitting: Anesthesiology

## 2011-04-03 ENCOUNTER — Other Ambulatory Visit: Payer: Self-pay

## 2011-04-03 DIAGNOSIS — M1611 Unilateral primary osteoarthritis, right hip: Secondary | ICD-10-CM | POA: Diagnosis present

## 2011-04-03 DIAGNOSIS — I272 Pulmonary hypertension, unspecified: Secondary | ICD-10-CM

## 2011-04-03 DIAGNOSIS — M1610 Unilateral primary osteoarthritis, unspecified hip: Principal | ICD-10-CM | POA: Diagnosis present

## 2011-04-03 DIAGNOSIS — I509 Heart failure, unspecified: Secondary | ICD-10-CM | POA: Diagnosis present

## 2011-04-03 DIAGNOSIS — E119 Type 2 diabetes mellitus without complications: Secondary | ICD-10-CM | POA: Diagnosis present

## 2011-04-03 DIAGNOSIS — M8970 Major osseous defect, unspecified site: Secondary | ICD-10-CM | POA: Diagnosis present

## 2011-04-03 DIAGNOSIS — Z96649 Presence of unspecified artificial hip joint: Secondary | ICD-10-CM

## 2011-04-03 DIAGNOSIS — J45909 Unspecified asthma, uncomplicated: Secondary | ICD-10-CM | POA: Diagnosis present

## 2011-04-03 DIAGNOSIS — M169 Osteoarthritis of hip, unspecified: Principal | ICD-10-CM | POA: Diagnosis present

## 2011-04-03 DIAGNOSIS — E871 Hypo-osmolality and hyponatremia: Secondary | ICD-10-CM | POA: Diagnosis not present

## 2011-04-03 DIAGNOSIS — I959 Hypotension, unspecified: Secondary | ICD-10-CM | POA: Diagnosis not present

## 2011-04-03 DIAGNOSIS — R895 Abnormal microbiological findings in specimens from other organs, systems and tissues: Secondary | ICD-10-CM

## 2011-04-03 DIAGNOSIS — Z6831 Body mass index (BMI) 31.0-31.9, adult: Secondary | ICD-10-CM

## 2011-04-03 DIAGNOSIS — N179 Acute kidney failure, unspecified: Secondary | ICD-10-CM

## 2011-04-03 DIAGNOSIS — I739 Peripheral vascular disease, unspecified: Secondary | ICD-10-CM | POA: Diagnosis present

## 2011-04-03 DIAGNOSIS — Z01812 Encounter for preprocedural laboratory examination: Secondary | ICD-10-CM

## 2011-04-03 DIAGNOSIS — D62 Acute posthemorrhagic anemia: Secondary | ICD-10-CM | POA: Diagnosis not present

## 2011-04-03 DIAGNOSIS — Z96659 Presence of unspecified artificial knee joint: Secondary | ICD-10-CM

## 2011-04-03 DIAGNOSIS — F0390 Unspecified dementia without behavioral disturbance: Secondary | ICD-10-CM | POA: Diagnosis present

## 2011-04-03 DIAGNOSIS — N182 Chronic kidney disease, stage 2 (mild): Secondary | ICD-10-CM | POA: Diagnosis present

## 2011-04-03 DIAGNOSIS — I2789 Other specified pulmonary heart diseases: Secondary | ICD-10-CM | POA: Diagnosis present

## 2011-04-03 DIAGNOSIS — I129 Hypertensive chronic kidney disease with stage 1 through stage 4 chronic kidney disease, or unspecified chronic kidney disease: Secondary | ICD-10-CM | POA: Diagnosis present

## 2011-04-03 DIAGNOSIS — E7800 Pure hypercholesterolemia, unspecified: Secondary | ICD-10-CM | POA: Diagnosis present

## 2011-04-03 DIAGNOSIS — M87059 Idiopathic aseptic necrosis of unspecified femur: Secondary | ICD-10-CM | POA: Diagnosis present

## 2011-04-03 HISTORY — DX: Unilateral primary osteoarthritis, right hip: M16.11

## 2011-04-03 HISTORY — DX: Thrombocytopenia, unspecified: D69.6

## 2011-04-03 HISTORY — DX: Pure hypercholesterolemia, unspecified: E78.00

## 2011-04-03 HISTORY — DX: Essential (primary) hypertension: I10

## 2011-04-03 HISTORY — PX: TOTAL HIP ARTHROPLASTY: SHX124

## 2011-04-03 HISTORY — DX: Rhabdomyolysis: M62.82

## 2011-04-03 HISTORY — DX: Acute kidney failure, unspecified: N17.9

## 2011-04-03 LAB — GLUCOSE, CAPILLARY: Glucose-Capillary: 153 mg/dL — ABNORMAL HIGH (ref 70–99)

## 2011-04-03 LAB — BASIC METABOLIC PANEL
CO2: 21 mEq/L (ref 19–32)
Chloride: 106 mEq/L (ref 96–112)
GFR calc non Af Amer: 39 mL/min — ABNORMAL LOW (ref >90)
Glucose, Bld: 91 mg/dL (ref 70–99)
Potassium: 4.5 mEq/L (ref 3.5–5.1)
Sodium: 138 mEq/L (ref 135–145)

## 2011-04-03 SURGERY — ARTHROPLASTY, HIP, TOTAL,POSTERIOR APPROACH
Anesthesia: General | Site: Hip | Laterality: Right | Wound class: Clean

## 2011-04-03 MED ORDER — PROPOFOL 10 MG/ML IV EMUL
INTRAVENOUS | Status: DC | PRN
  Administered 2011-04-03: 150 mg via INTRAVENOUS

## 2011-04-03 MED ORDER — RIVAROXABAN 10 MG PO TABS
10.0000 mg | ORAL_TABLET | Freq: Every day | ORAL | Status: DC
  Administered 2011-04-04: 10 mg via ORAL
  Filled 2011-04-03 (×2): qty 1

## 2011-04-03 MED ORDER — CIPROFLOXACIN HCL 500 MG PO TABS
500.0000 mg | ORAL_TABLET | Freq: Two times a day (BID) | ORAL | Status: DC
  Administered 2011-04-04: 500 mg via ORAL
  Filled 2011-04-03 (×3): qty 1

## 2011-04-03 MED ORDER — OLMESARTAN MEDOXOMIL 40 MG PO TABS
40.0000 mg | ORAL_TABLET | Freq: Every day | ORAL | Status: DC
Start: 2011-04-04 — End: 2011-04-04
  Filled 2011-04-03: qty 1

## 2011-04-03 MED ORDER — ROCURONIUM BROMIDE 100 MG/10ML IV SOLN
INTRAVENOUS | Status: DC | PRN
  Administered 2011-04-03: 20 mg via INTRAVENOUS
  Administered 2011-04-03: 30 mg via INTRAVENOUS

## 2011-04-03 MED ORDER — LACTATED RINGERS IV SOLN
INTRAVENOUS | Status: DC
  Administered 2011-04-03 (×2): via INTRAVENOUS

## 2011-04-03 MED ORDER — GLYCOPYRROLATE 0.2 MG/ML IJ SOLN
INTRAMUSCULAR | Status: DC | PRN
  Administered 2011-04-03: .8 mg via INTRAVENOUS

## 2011-04-03 MED ORDER — HYDROMORPHONE HCL PF 1 MG/ML IJ SOLN
0.2500 mg | INTRAMUSCULAR | Status: DC | PRN
  Administered 2011-04-03: 0.5 mg via INTRAVENOUS

## 2011-04-03 MED ORDER — THROMBIN 5000 UNITS EX SOLR
CUTANEOUS | Status: AC
  Filled 2011-04-03: qty 10000

## 2011-04-03 MED ORDER — CEFAZOLIN SODIUM-DEXTROSE 2-3 GM-% IV SOLR
2.0000 g | INTRAVENOUS | Status: AC
  Administered 2011-04-03: 2 g via INTRAVENOUS

## 2011-04-03 MED ORDER — LORAZEPAM 0.5 MG PO TABS: 0.5000 mg | ORAL_TABLET | Freq: Two times a day (BID) | ORAL | Status: DC | PRN

## 2011-04-03 MED ORDER — METHOCARBAMOL 100 MG/ML IJ SOLN
500.0000 mg | Freq: Four times a day (QID) | INTRAVENOUS | Status: DC | PRN
  Filled 2011-04-03 (×7): qty 5

## 2011-04-03 MED ORDER — HYDROMORPHONE HCL PF 1 MG/ML IJ SOLN
INTRAMUSCULAR | Status: AC
  Filled 2011-04-03: qty 1

## 2011-04-03 MED ORDER — HYDROMORPHONE HCL PF 1 MG/ML IJ SOLN: 0.5000 mg | INTRAMUSCULAR | Status: DC | PRN

## 2011-04-03 MED ORDER — CEFAZOLIN SODIUM 1-5 GM-% IV SOLN
1.0000 g | Freq: Four times a day (QID) | INTRAVENOUS | Status: DC
  Administered 2011-04-04 (×2): 1 g via INTRAVENOUS
  Filled 2011-04-03 (×3): qty 50

## 2011-04-03 MED ORDER — BUPIVACAINE LIPOSOME 1.3 % IJ SUSP
20.0000 mL | Freq: Once | INTRAMUSCULAR | Status: AC
  Administered 2011-04-03: 20 mL
  Filled 2011-04-03: qty 20

## 2011-04-03 MED ORDER — CEFAZOLIN SODIUM 1-5 GM-% IV SOLN
INTRAVENOUS | Status: AC
  Filled 2011-04-03: qty 100

## 2011-04-03 MED ORDER — ALUM & MAG HYDROXIDE-SIMETH 200-200-20 MG/5ML PO SUSP: 30.0000 mL | ORAL | Status: DC | PRN

## 2011-04-03 MED ORDER — LIDOCAINE HCL (CARDIAC) 20 MG/ML IV SOLN
INTRAVENOUS | Status: DC | PRN
  Administered 2011-04-03: 80 mg via INTRAVENOUS

## 2011-04-03 MED ORDER — ONDANSETRON HCL 4 MG/2ML IJ SOLN
INTRAMUSCULAR | Status: DC | PRN
  Administered 2011-04-03: 4 mg via INTRAVENOUS

## 2011-04-03 MED ORDER — BACITRACIN ZINC 500 UNIT/GM EX OINT
TOPICAL_OINTMENT | CUTANEOUS | Status: AC
  Filled 2011-04-03: qty 15

## 2011-04-03 MED ORDER — HYDROCODONE-ACETAMINOPHEN 5-325 MG PO TABS
1.0000 | ORAL_TABLET | ORAL | Status: DC | PRN
  Administered 2011-04-04: 1 via ORAL
  Filled 2011-04-03: qty 1

## 2011-04-03 MED ORDER — PROMETHAZINE HCL 25 MG/ML IJ SOLN: 6.2500 mg | INTRAMUSCULAR | Status: DC | PRN

## 2011-04-03 MED ORDER — SUCCINYLCHOLINE CHLORIDE 20 MG/ML IJ SOLN
INTRAMUSCULAR | Status: DC | PRN
  Administered 2011-04-03: 100 mg via INTRAVENOUS

## 2011-04-03 MED ORDER — METOCLOPRAMIDE HCL 10 MG PO TABS: 5.0000 mg | ORAL_TABLET | Freq: Three times a day (TID) | ORAL | Status: DC | PRN

## 2011-04-03 MED ORDER — EPHEDRINE SULFATE 50 MG/ML IJ SOLN
INTRAMUSCULAR | Status: DC | PRN
  Administered 2011-04-03 (×3): 10 mg via INTRAVENOUS
  Administered 2011-04-03: 5 mg via INTRAVENOUS

## 2011-04-03 MED ORDER — FERROUS SULFATE 325 (65 FE) MG PO TABS
325.0000 mg | ORAL_TABLET | Freq: Three times a day (TID) | ORAL | Status: DC
  Administered 2011-04-04 – 2011-04-09 (×15): 325 mg via ORAL
  Filled 2011-04-03 (×18): qty 1

## 2011-04-03 MED ORDER — ACETAMINOPHEN 325 MG PO TABS: 650.0000 mg | ORAL_TABLET | Freq: Four times a day (QID) | ORAL | Status: DC | PRN

## 2011-04-03 MED ORDER — HYDROMORPHONE HCL PF 1 MG/ML IJ SOLN
INTRAMUSCULAR | Status: AC
  Administered 2011-04-03: 0.5 mg via INTRAVENOUS
  Filled 2011-04-03: qty 1

## 2011-04-03 MED ORDER — METOCLOPRAMIDE HCL 5 MG/ML IJ SOLN: 5.0000 mg | Freq: Three times a day (TID) | INTRAMUSCULAR | Status: DC | PRN

## 2011-04-03 MED ORDER — INSULIN ASPART 100 UNIT/ML ~~LOC~~ SOLN
0.0000 [IU] | Freq: Three times a day (TID) | SUBCUTANEOUS | Status: DC
  Filled 2011-04-03: qty 3

## 2011-04-03 MED ORDER — THROMBIN 5000 UNITS EX SOLR
CUTANEOUS | Status: DC | PRN
  Administered 2011-04-03: 10000 [IU] via TOPICAL

## 2011-04-03 MED ORDER — HYDROMORPHONE HCL PF 1 MG/ML IJ SOLN
INTRAMUSCULAR | Status: DC | PRN
  Administered 2011-04-03 (×2): 0.5 mg via INTRAVENOUS

## 2011-04-03 MED ORDER — FLEET ENEMA 7-19 GM/118ML RE ENEM: 1.0000 | ENEMA | Freq: Once | RECTAL | Status: AC | PRN

## 2011-04-03 MED ORDER — NEOSTIGMINE METHYLSULFATE 1 MG/ML IJ SOLN
INTRAMUSCULAR | Status: DC | PRN
  Administered 2011-04-03: 5 mg via INTRAVENOUS

## 2011-04-03 MED ORDER — PHENYLEPHRINE HCL 10 MG/ML IJ SOLN
INTRAMUSCULAR | Status: DC | PRN
  Administered 2011-04-03 (×2): 80 ug via INTRAVENOUS

## 2011-04-03 MED ORDER — SODIUM CHLORIDE 0.9 % IR SOLN
Status: DC | PRN
  Administered 2011-04-03: 1000 mL

## 2011-04-03 MED ORDER — FENTANYL CITRATE 0.05 MG/ML IJ SOLN
INTRAMUSCULAR | Status: AC
  Filled 2011-04-03: qty 2

## 2011-04-03 MED ORDER — FENTANYL CITRATE 0.05 MG/ML IJ SOLN
INTRAMUSCULAR | Status: DC | PRN
  Administered 2011-04-03 (×2): 50 ug via INTRAVENOUS
  Administered 2011-04-03: 100 ug via INTRAVENOUS
  Administered 2011-04-03: 50 ug via INTRAVENOUS

## 2011-04-03 MED ORDER — SODIUM CHLORIDE 0.9 % IR SOLN
Status: DC | PRN
  Administered 2011-04-03: 19:00:00

## 2011-04-03 MED ORDER — LACTATED RINGERS IV SOLN
INTRAVENOUS | Status: DC | PRN
  Administered 2011-04-03: 17:00:00 via INTRAVENOUS

## 2011-04-03 MED ORDER — SODIUM CHLORIDE 0.9 % IJ SOLN
INTRAMUSCULAR | Status: DC | PRN
  Administered 2011-04-03: 20 mL via INTRAVENOUS

## 2011-04-03 MED ORDER — CIPROFLOXACIN IN D5W 400 MG/200ML IV SOLN
INTRAVENOUS | Status: DC | PRN
  Administered 2011-04-03: 400 mg via INTRAVENOUS

## 2011-04-03 MED ORDER — PHENOL 1.4 % MT LIQD
1.0000 | OROMUCOSAL | Status: DC | PRN
  Filled 2011-04-03: qty 177

## 2011-04-03 MED ORDER — BISACODYL 10 MG RE SUPP: 10.0000 mg | Freq: Every day | RECTAL | Status: DC | PRN

## 2011-04-03 MED ORDER — MENTHOL 3 MG MT LOZG
1.0000 | LOZENGE | OROMUCOSAL | Status: DC | PRN
  Filled 2011-04-03: qty 9

## 2011-04-03 MED ORDER — ONDANSETRON HCL 4 MG/2ML IJ SOLN: 4.0000 mg | Freq: Four times a day (QID) | INTRAMUSCULAR | Status: DC | PRN

## 2011-04-03 MED ORDER — METHOCARBAMOL 500 MG PO TABS
500.0000 mg | ORAL_TABLET | Freq: Four times a day (QID) | ORAL | Status: DC | PRN
  Administered 2011-04-06 – 2011-04-07 (×3): 500 mg via ORAL
  Filled 2011-04-03 (×3): qty 1

## 2011-04-03 MED ORDER — HYDROMORPHONE HCL PF 1 MG/ML IJ SOLN
0.5000 mg | INTRAMUSCULAR | Status: DC | PRN
  Administered 2011-04-03: 0.5 mg via INTRAVENOUS

## 2011-04-03 MED ORDER — POLYETHYLENE GLYCOL 3350 17 G PO PACK
17.0000 g | PACK | Freq: Every day | ORAL | Status: DC | PRN
  Administered 2011-04-08: 17 g via ORAL
  Filled 2011-04-03: qty 1

## 2011-04-03 MED ORDER — ONDANSETRON HCL 4 MG PO TABS: 4.0000 mg | ORAL_TABLET | Freq: Four times a day (QID) | ORAL | Status: DC | PRN

## 2011-04-03 MED ORDER — ACETAMINOPHEN 650 MG RE SUPP: 650.0000 mg | Freq: Four times a day (QID) | RECTAL | Status: DC | PRN

## 2011-04-03 MED ORDER — CIPROFLOXACIN IN D5W 400 MG/200ML IV SOLN
INTRAVENOUS | Status: AC
  Filled 2011-04-03: qty 200

## 2011-04-03 MED ORDER — OXYCODONE-ACETAMINOPHEN 5-325 MG PO TABS: 1.0000 | ORAL_TABLET | ORAL | Status: DC | PRN

## 2011-04-03 MED ORDER — LACTATED RINGERS IV SOLN
INTRAVENOUS | Status: AC
  Administered 2011-04-04: 13:00:00 via INTRAVENOUS

## 2011-04-03 SURGICAL SUPPLY — 55 items
BAG SPEC THK2 15X12 ZIP CLS (MISCELLANEOUS) ×1
BAG ZIPLOCK 12X15 (MISCELLANEOUS) ×2 IMPLANT
BLADE SAW SAG 73X25 THK (BLADE) ×1
BLADE SAW SGTL 73X25 THK (BLADE) ×1 IMPLANT
BNDG COHESIVE 4X5 TAN STRL (GAUZE/BANDAGES/DRESSINGS) ×1 IMPLANT
CLOTH BEACON ORANGE TIMEOUT ST (SAFETY) ×2 IMPLANT
DRAPE INCISE IOBAN 66X45 STRL (DRAPES) ×2 IMPLANT
DRAPE ORTHO SPLIT 77X108 STRL (DRAPES) ×4
DRAPE POUCH INSTRU U-SHP 10X18 (DRAPES) ×2 IMPLANT
DRAPE SURG 17X11 SM STRL (DRAPES) ×2 IMPLANT
DRAPE SURG ORHT 6 SPLT 77X108 (DRAPES) ×2 IMPLANT
DRAPE U-SHAPE 47X51 STRL (DRAPES) ×2 IMPLANT
DRSG ADAPTIC 3X8 NADH LF (GAUZE/BANDAGES/DRESSINGS) ×1 IMPLANT
DRSG EMULSION OIL 3X16 NADH (GAUZE/BANDAGES/DRESSINGS) ×2 IMPLANT
DRSG PAD ABDOMINAL 8X10 ST (GAUZE/BANDAGES/DRESSINGS) ×3 IMPLANT
DURAPREP 26ML APPLICATOR (WOUND CARE) ×3 IMPLANT
ELECT BLADE TIP CTD 4 INCH (ELECTRODE) ×2 IMPLANT
ELECT REM PT RETURN 9FT ADLT (ELECTROSURGICAL) ×2
ELECTRODE REM PT RTRN 9FT ADLT (ELECTROSURGICAL) ×1 IMPLANT
EVACUATOR 1/8 PVC DRAIN (DRAIN) ×2 IMPLANT
FACESHIELD LNG OPTICON STERILE (SAFETY) ×8 IMPLANT
FLOSEAL 10ML (HEMOSTASIS) ×1 IMPLANT
GAUZE SPONGE 4X4 12PLY STRL LF (GAUZE/BANDAGES/DRESSINGS) ×1 IMPLANT
GLOVE BIOGEL M 6.5 STRL (GLOVE) ×2 IMPLANT
GLOVE BIOGEL PI IND STRL 7.0 (GLOVE) IMPLANT
GLOVE BIOGEL PI IND STRL 7.5 (GLOVE) IMPLANT
GLOVE BIOGEL PI IND STRL 8.5 (GLOVE) ×1 IMPLANT
GLOVE BIOGEL PI INDICATOR 7.0 (GLOVE) ×2
GLOVE BIOGEL PI INDICATOR 7.5 (GLOVE) ×1
GLOVE BIOGEL PI INDICATOR 8.5 (GLOVE) ×1
GLOVE ECLIPSE 8.0 STRL XLNG CF (GLOVE) ×6 IMPLANT
GLOVE ORTHO TXT STRL SZ7.5 (GLOVE) ×1 IMPLANT
GLOVE SURG SS PI 6.5 STRL IVOR (GLOVE) ×2 IMPLANT
GOWN PREVENTION PLUS LG XLONG (DISPOSABLE) ×4 IMPLANT
GOWN STRL REIN XL XLG (GOWN DISPOSABLE) ×5 IMPLANT
IMMOBILIZER KNEE 20 (SOFTGOODS) ×2
IMMOBILIZER KNEE 20 THIGH 36 (SOFTGOODS) IMPLANT
KIT BASIN OR (CUSTOM PROCEDURE TRAY) ×2 IMPLANT
MANIFOLD NEPTUNE II (INSTRUMENTS) ×2 IMPLANT
NEEDLE HYPO 22GX1.5 SAFETY (NEEDLE) ×2 IMPLANT
PACK TOTAL JOINT (CUSTOM PROCEDURE TRAY) ×2 IMPLANT
POSITIONER SURGICAL ARM (MISCELLANEOUS) ×2 IMPLANT
SPONGE LAP 18X18 X RAY DECT (DISPOSABLE) ×3 IMPLANT
SPONGE SURGIFOAM ABS GEL 100 (HEMOSTASIS) ×2 IMPLANT
STAPLER VISISTAT 35W (STAPLE) ×2 IMPLANT
SUCTION FRAZIER TIP 10 FR DISP (SUCTIONS) ×2 IMPLANT
SUT VIC AB 0 CT1 27 (SUTURE) ×4
SUT VIC AB 0 CT1 27XBRD ANTBC (SUTURE) ×2 IMPLANT
SUT VIC AB 1 CT1 27 (SUTURE) ×16
SUT VIC AB 1 CT1 27XBRD ANTBC (SUTURE) ×5 IMPLANT
SYR 20CC LL (SYRINGE) ×2 IMPLANT
TAPE CLOTH SURG 6X10 WHT LF (GAUZE/BANDAGES/DRESSINGS) ×1 IMPLANT
TOWEL OR 17X26 10 PK STRL BLUE (TOWEL DISPOSABLE) ×4 IMPLANT
TRAY FOLEY CATH 14FRSI W/METER (CATHETERS) ×2 IMPLANT
WATER STERILE IRR 1500ML POUR (IV SOLUTION) ×2 IMPLANT

## 2011-04-03 NOTE — Anesthesia Preprocedure Evaluation (Addendum)
Anesthesia Evaluation  Patient identified by MRN, date of birth, ID band Patient awake  General Assessment Comment:oriented  Reviewed: Allergy & Precautions, H&P , NPO status , Patient's Chart, lab work & pertinent test results, reviewed documented beta blocker date and time   Airway Mallampati: II TM Distance: >3 FB Neck ROM: Full    Dental   Pulmonary neg pulmonary ROS,  clear to auscultation  + decreased breath sounds      Cardiovascular hypertension, Pt. on medications Regular Normal Denies cardiac symptoms Given CV clearance   Neuro/Psych Negative Neurological ROS  Negative Psych ROS   GI/Hepatic negative GI ROS, Neg liver ROS,   Endo/Other  Hx hypoglycemia, pt and family denies DM Recent 100 lbs wt loss  Renal/GU ARF in June 2012  Genitourinary negative   Musculoskeletal negative musculoskeletal ROS (+)   Abdominal   Peds negative pediatric ROS (+)  Hematology negative hematology ROS (+)   Anesthesia Other Findings   Reproductive/Obstetrics negative OB ROS                          Anesthesia Physical Anesthesia Plan  ASA: III  Anesthesia Plan: General   Post-op Pain Management:    Induction: Intravenous  Airway Management Planned: Oral ETT  Additional Equipment:   Intra-op Plan:   Post-operative Plan: Extubation in OR  Informed Consent: I have reviewed the patients History and Physical, chart, labs and discussed the procedure including the risks, benefits and alternatives for the proposed anesthesia with the patient or authorized representative who has indicated his/her understanding and acceptance.     Plan Discussed with: CRNA and Surgeon  Anesthesia Plan Comments:         Anesthesia Quick Evaluation

## 2011-04-03 NOTE — H&P (Signed)
Penny Delgado is an 77 y.o. female.   Chief Complaint: Right hip pain HPI: She reports right hip pain that has been present for many years. She is non-ambulatory due to pain and other medical issues. She reports that her pain is primarily in the right groin. X-rays show complete loss of femoral head secondary to AVN. She has lost about 100 lbs over the last few years. She has been cleared for surgery by her cardiologist Dr. Caprice Kluver.   Past Medical History  Diagnosis Date  . Rhabdomyolysis 08-15-2010  . Acute renal failure 08-15-2010 secondary to rhabdomylosis  . Hypertension   . Diabetes mellitus   . Asthma   . Hypercholesteremia   . Thrombocytopenia   . Primary osteoarthritis of right hip     Past Surgical History  Procedure Date  . Bilateral knee arthroplasty one done march 2004    2004  . Back surgery  15 yrs ago    not sure upper or lower back  . Cholecystectomy yrs ago  . Abdominal hysterectomy yrs ago    History reviewed. No pertinent family history. Social History:  reports that she has never smoked. She has never used smokeless tobacco. She reports that she does not drink alcohol or use illicit drugs.  Allergies:  Allergies  Allergen Reactions  . Mirtazapine     Medications Prior to Admission  Medication Dose Route Frequency Provider Last Rate Last Dose  . ceFAZolin (ANCEF) IVPB 2 g/50 mL premix  2 g Intravenous 60 min Pre-Op Jacki Cones, MD      . lactated ringers infusion   Intravenous Continuous Dreydon Cardenas Tamala Ser, PA       No current outpatient prescriptions on file as of 04/03/2011.    Results for orders placed during the hospital encounter of 04/03/11 (from the past 48 hour(s))  GLUCOSE, CAPILLARY     Status: Abnormal   Collection Time   04/03/11  1:37 PM      Component Value Range Comment   Glucose-Capillary 104 (*) 70 - 99 (mg/dL)    Review of Systems  Constitutional: Positive for weight loss. Negative for fever, chills and malaise/fatigue.    HENT: Negative for hearing loss, congestion and sore throat.   Eyes: Negative for blurred vision, double vision and photophobia.  Respiratory: Negative for cough and shortness of breath.   Cardiovascular: Positive for claudication. Negative for chest pain, palpitations and leg swelling.  Gastrointestinal: Negative for nausea, vomiting, abdominal pain and blood in stool.  Genitourinary: Negative for dysuria, hematuria and flank pain.  Musculoskeletal: Positive for myalgias, back pain and joint pain. Negative for falls.  Skin: Negative for rash.  Neurological: Positive for tingling and tremors. Negative for dizziness, focal weakness, loss of consciousness and headaches.  Endo/Heme/Allergies: Does not bruise/bleed easily.  Psychiatric/Behavioral: Negative for depression and memory loss. The patient is nervous/anxious. The patient does not have insomnia.     Blood pressure 116/68, pulse 82, temperature 97.1 F (36.2 C), temperature source Oral, resp. rate 16, height 5\' 5"  (1.651 m), weight 86.637 kg (191 lb), SpO2 94.00%. Physical Exam  Constitutional: She is oriented to person, place, and time. She appears well-developed and well-nourished.  HENT:  Head: Normocephalic and atraumatic.  Eyes: EOM are normal. Pupils are equal, round, and reactive to light.  Neck: Normal range of motion. Neck supple.  Cardiovascular: Normal rate, regular rhythm, normal heart sounds and intact distal pulses.   No murmur heard. Respiratory: Effort normal and breath sounds normal. She  has no wheezes. She exhibits no tenderness.  GI: Soft. Bowel sounds are normal. She exhibits no mass. There is no tenderness.  Musculoskeletal:       Right hip: She exhibits decreased range of motion and decreased strength.       Left hip: Normal.       Right knee: Normal.       Left knee: Normal.       Right lower leg: She exhibits no tenderness and no swelling.       Left lower leg: She exhibits no tenderness and no swelling.        Legs: Lymphadenopathy:    She has no cervical adenopathy.  Neurological: She is alert and oriented to person, place, and time. She has normal reflexes.  Skin: No rash noted.  Psychiatric: She has a normal mood and affect. Her behavior is normal.     Assessment/Plan Avascular necrosis of femoral head, right hip Right total hip arthoplasty  Penny Delgado [name] 04/03/2011, 2:39 PM

## 2011-04-03 NOTE — Brief Op Note (Signed)
04/03/2011  8:08 PM  PATIENT:  Penny Delgado  77 y.o. female  PRE-OPERATIVE DIAGNOSIS:  avascular necrosis right  hip  POST-OPERATIVE DIAGNOSIS:  avascular necrosis r ight hip  PROCEDURE:  Procedure(s): TOTAL HIP ARTHROPLASTY  SURGEON:  Surgeon(s): Shelda Pal, MD Jacki Cones, MD  PHYSICIAN ASSISTANT:   ASSISTANTS: Dr,Matt Charlann Boxer and Dimitri Ped PA   ANESTHESIA:   general  EBL:  Total I/O In: 2650 [I.V.:2650] Out: 425 [Urine:75; Blood:350]  BLOOD ADMINISTERED:none  DRAINS: (Right Hip) Hemovact drain(s) in the Right Hip with  Suction Open   LOCAL MEDICATIONS USED:  BUPIVICAINE 20CC mixed with 20cc Normal Saline  SPECIMEN:  No Specimen  DISPOSITION OF SPECIMEN:  N/A  COUNTS:  YES  TOURNIQUET:  * No tourniquets in log *  DICTATION: .Other Dictation: Dictation Number [id number]  PLAN OF CARE: Admit to inpatient   PATIENT DISPOSITION:  PACU - hemodynamically stable.

## 2011-04-03 NOTE — Transfer of Care (Signed)
Immediate Anesthesia Transfer of Care Note  Patient: Penny Delgado  Procedure(s) Performed:  TOTAL HIP ARTHROPLASTY  Patient Location: PACU  Anesthesia Type: General  Level of Consciousness: awake, alert , oriented and patient cooperative  Airway & Oxygen Therapy: Patient Spontanous Breathing and Patient connected to face mask oxygen  Post-op Assessment: Report given to PACU RN and Post -op Vital signs reviewed and stable  Post vital signs: Reviewed and stable  Complications: No apparent anesthesia complications

## 2011-04-03 NOTE — Interval H&P Note (Signed)
History and Physical Interval Note:  04/03/2011 5:06 PM  Penny Delgado  has presented today for surgery, with the diagnosis of avascular necrosis right  hip  The various methods of treatment have been discussed with the patient and family. After consideration of risks, benefits and other options for treatment, the patient has consented to  Procedure(s): TOTAL HIP ARTHROPLASTY as a surgical intervention .  The patients' history has been reviewed, patient examined, no change in status, stable for surgery.  I have reviewed the patients' chart and labs.  Questions were answered to the patient's satisfaction.     Terrick Allred A

## 2011-04-03 NOTE — Anesthesia Postprocedure Evaluation (Signed)
  Anesthesia Post-op Note  Patient: Penny Delgado  Procedure(s) Performed:  TOTAL HIP ARTHROPLASTY  Patient Location: PACU  Anesthesia Type: General  Level of Consciousness: sedated and patient cooperative  Airway and Oxygen Therapy: Patient Spontanous Breathing and Patient connected to nasal cannula oxygen  Post-op Pain: mild  Post-op Assessment: Post-op Vital signs reviewed, Patient's Cardiovascular Status Stable, Respiratory Function Stable and Patent Airway  Post-op Vital Signs: stable  Complications: No apparent anesthesia complications

## 2011-04-03 NOTE — Preoperative (Signed)
Beta Blockers   Reason not to administer Beta Blockers:Not Applicable 

## 2011-04-04 ENCOUNTER — Encounter (HOSPITAL_COMMUNITY): Payer: Self-pay

## 2011-04-04 DIAGNOSIS — I959 Hypotension, unspecified: Secondary | ICD-10-CM

## 2011-04-04 DIAGNOSIS — I272 Pulmonary hypertension, unspecified: Secondary | ICD-10-CM

## 2011-04-04 DIAGNOSIS — I509 Heart failure, unspecified: Secondary | ICD-10-CM

## 2011-04-04 DIAGNOSIS — N179 Acute kidney failure, unspecified: Secondary | ICD-10-CM

## 2011-04-04 LAB — CBC
Hemoglobin: 10.5 g/dL — ABNORMAL LOW (ref 12.0–15.0)
MCH: 31.4 pg (ref 26.0–34.0)
MCV: 96.4 fL (ref 78.0–100.0)
RBC: 3.34 MIL/uL — ABNORMAL LOW (ref 3.87–5.11)
WBC: 13.6 10*3/uL — ABNORMAL HIGH (ref 4.0–10.5)

## 2011-04-04 LAB — GLUCOSE, CAPILLARY
Glucose-Capillary: 133 mg/dL — ABNORMAL HIGH (ref 70–99)
Glucose-Capillary: 112 mg/dL — ABNORMAL HIGH (ref 70–99)
Glucose-Capillary: 162 mg/dL — ABNORMAL HIGH (ref 70–99)

## 2011-04-04 LAB — BASIC METABOLIC PANEL
CO2: 17 mEq/L — ABNORMAL LOW (ref 19–32)
Calcium: 9.1 mg/dL (ref 8.4–10.5)
Chloride: 104 mEq/L (ref 96–112)
Creatinine, Ser: 1.55 mg/dL — ABNORMAL HIGH (ref 0.50–1.10)
Glucose, Bld: 198 mg/dL — ABNORMAL HIGH (ref 70–99)
Sodium: 136 mEq/L (ref 135–145)

## 2011-04-04 LAB — CARDIAC PANEL(CRET KIN+CKTOT+MB+TROPI)
CK, MB: 20.7 ng/mL (ref 0.3–4.0)
Relative Index: 2.7 — ABNORMAL HIGH (ref 0.0–2.5)
Total CK: 781 U/L — ABNORMAL HIGH (ref 7–177)

## 2011-04-04 LAB — HEMOGLOBIN AND HEMATOCRIT, BLOOD: Hemoglobin: 8.6 g/dL — ABNORMAL LOW (ref 12.0–15.0)

## 2011-04-04 MED ORDER — HYDROCODONE-ACETAMINOPHEN 5-325 MG PO TABS
2.0000 | ORAL_TABLET | ORAL | Status: DC
  Administered 2011-04-04 – 2011-04-05 (×4): 2 via ORAL
  Filled 2011-04-04 (×4): qty 2

## 2011-04-04 MED ORDER — CEFAZOLIN SODIUM 1-5 GM-% IV SOLN
1.0000 g | Freq: Four times a day (QID) | INTRAVENOUS | Status: AC
  Administered 2011-04-04: 1 g via INTRAVENOUS
  Filled 2011-04-04: qty 50

## 2011-04-04 MED ORDER — LACTATED RINGERS IV BOLUS (SEPSIS)
1000.0000 mL | Freq: Once | INTRAVENOUS | Status: AC
  Administered 2011-04-04: 1000 mL via INTRAVENOUS

## 2011-04-04 MED ORDER — CIPROFLOXACIN HCL 500 MG PO TABS
500.0000 mg | ORAL_TABLET | Freq: Two times a day (BID) | ORAL | Status: DC
  Administered 2011-04-04 – 2011-04-05 (×3): 500 mg via ORAL
  Filled 2011-04-04 (×5): qty 1

## 2011-04-04 MED FILL — Mupirocin Oint 2%: CUTANEOUS | Qty: 22 | Status: AC

## 2011-04-04 NOTE — Progress Notes (Signed)
Text page sent to MD with critical MB of 20.7

## 2011-04-04 NOTE — Progress Notes (Signed)
Subjective: Output is low and BP decreased. Will adjust IV fluids and keep foley in to monitor hourly output  Hemovac DCd..   Objective: Vital signs in last 24 hours: Temp:  [96 F (35.6 C)-97.6 F (36.4 C)] 97.6 F (36.4 C) (02/06 0549) Pulse Rate:  [72-92] 90  (02/06 0549) Resp:  [12-27] 12  (02/06 0549) BP: (76-116)/(32-68) 78/42 mmHg (02/06 0642) SpO2:  [94 %-100 %] 100 % (02/06 0549) Weight:  [86.637 kg (191 lb)] 86.637 kg (191 lb) (02/05 1336)  Intake/Output from previous day: 02/05 0701 - 02/06 0700 In: 2925 [I.V.:2800] Out: 1370 [Urine:325; Drains:495; Blood:550] Intake/Output this shift:     Basename 04/04/11 0440 04/03/11 1730  HGB 10.5* 12.1    Basename 04/04/11 0440 04/03/11 1730  WBC 13.6* --  RBC 3.34* --  HCT 32.2* 36.8  PLT 183 --    Basename 04/04/11 0440 04/03/11 1730  NA 136 138  K 5.0 4.5  CL 104 106  CO2 17* 21  BUN 32* 30*  CREATININE 1.55* 1.32*  GLUCOSE 198* 91  CALCIUM 9.1 9.7   No results found for this basename: LABPT:2,INR:2 in the last 72 hours  Neurologically intact Dorsiflexion/Plantar flexion intact  Assessment/Plan: Bed Rest and close monitoring of BP.   Penny Delgado A 04/04/2011, 7:56 AM

## 2011-04-04 NOTE — Progress Notes (Signed)
CSW following to assist with d/c planning.  Met with pt and family this am. Pt shows signs of increased  confusion. Family states this is not pt's baseline ( NSG / MD  aware ) . Pt is from [facility] and family would like her to return when stable for d/c. Message left for SNF to confirm d/c plan. CSW will follow to assist with d/c planning to SNF.

## 2011-04-04 NOTE — Progress Notes (Signed)
MD notified of continued low BP, new orders received

## 2011-04-04 NOTE — Progress Notes (Signed)
Pt eval deferred due to pt on  Bedrest.  Will attempt again when activity status updated.

## 2011-04-04 NOTE — Consult Note (Signed)
[facility]ITAL[facility] CONSULT NOTE Pleasant 77 year old Caucasian female presented for elective right hip repair and underwent this 04/03/11. Subsequent to this it was noted that she started to become hypotensive this morning and she had a decreased urine output. I was called by RN on the floor regarding the patient as Dr. Darrelyn Hillock was scrubbed in in a case. It appears that the patient underwent total hip arthroplasty at around 8 PM yesterday. Blood loss was about 350 cc. Hemovac was placed and was removed this morning.   Patient states she's an incredible pain in her right hip but otherwise has no specific complaints. She has no chest pain or shortness of breath or nausea or vomiting. She was able to take 2 glasses of water this morning without any issues. She has no blurred vision double vision no specific chest pain no nausea vomiting no diarrhea.  Rest past medical history reviewed in detail and form part of my note  Her son is her next of kin Yvonne Stopher can be reached at [phone]  Recommendations Principal Problem:  *Hypotension-? 2/2 to ABLA ?--Would bolus IVF 1000 cc LR.  Rpt blood pressure q hourly x 3 times.  Then place on a rate of IV fluids at 200 cc/hour for 12 hours.  If this blood pressure doesn't resolve, would consider getting Critical care MD involved with her care, as may need pressors for this. Rpt CBC q4-6 hourly and reassess.  Would transfuse only if she dorps below Hemoglobin of 7, as she has no active cardiac disease per my review of her charts  Active Problems:  Osteoarthritis of right hip-s/p surgery 04/03/11 for AVN on the R hip.  Wuld cautiously use PO Percocet or equivalent at regular Q4 hour intervals, as she will need pain control, but blood pressure is a limiting factor for IV pain meds.  She has difficult IV access and agree with PICC line placement.  Continue Zaroxolyn 10 mg for DVT prophylaxis   Morbid obesity-stable   AKI (acute kidney injury) superimposed on CKD  stage 2-a BMEt from Jan 25th 2013 = Bun 32/1.12.  Her creatinine today is 1.55.  I have once again bolused IVF and will keep her on a rate of 200 cc.  Hopefully her renal function will pick-up and she will pass more urine. If not we will consult nephrology to help assist in her care and we will repeat a bmet in the morning   CHF-Ef 80% on Echo 02/23/2011, with normal wall motion-Would hold any Htn meds for now.  No need at present for any intervention. Cardiologist is Dr. little and he has recently done a Myoview stress test which showed an EF of 80%   Pulmonary HTN-Stable.  Care with fluid resuscitation  H/o Rhabdomyolysis-Would get CPK as she might be pre-disposed to this.  Diet controlled DM-Stable-would hold hypoglycemics. Continue sliding scale insulin for coverage   Coury Grieger,JAI 04/04/2011, 12:22 PM   Medications: Scheduled Meds:   . bupivacaine liposome  20 mL Infiltration Once  .  ceFAZolin (ANCEF) IV  1 g Intravenous Q6H  .  ceFAZolin (ANCEF) IV  2 g Intravenous 60 min Pre-Op  . ciprofloxacin  500 mg Oral BID  . ferrous sulfate  325 mg Oral TID PC  . HYDROmorphone      . insulin aspart  0-15 Units Subcutaneous TID WC  . lactated ringers  1,000 mL Intravenous Once  . rivaroxaban  10 mg Oral Q breakfast  . DISCONTD:  ceFAZolin (ANCEF) IV  1  g Intravenous Q6H  . DISCONTD: ciprofloxacin  500 mg Oral BID  . DISCONTD: olmesartan  40 mg Oral QPC breakfast   Continuous Infusions:   . lactated ringers    . DISCONTD: lactated ringers 100 mL/hr at 04/03/11 2030   PRN Meds:.acetaminophen, acetaminophen, alum & mag hydroxide-simeth, bisacodyl, HYDROcodone-acetaminophen, HYDROmorphone, LORazepam, menthol-cetylpyridinium, methocarbamol(ROBAXIN) IV, methocarbamol, metoCLOPramide (REGLAN) injection, ondansetron (ZOFRAN) IV, ondansetron, oxyCODONE-acetaminophen, phenol, polyethylene glycol, sodium phosphate, DISCONTD: HYDROmorphone, DISCONTD:  HYDROmorphone (DILAUDID) injection, DISCONTD:  metoCLOPramide DISCONTD: polymyxin / bacitracin (DOUBLE ANTIBIOTIC) irrigation, DISCONTD: promethazine, DISCONTD: sodium chloride, DISCONTD: sodium chloride irrigation, DISCONTD: thrombin  Objective: Weight change:   Intake/Output Summary (Last 24 hours) at 04/04/11 1222 Last data filed at 04/04/11 0645  Gross per 24 hour  Intake   2925 ml  Output   1370 ml  Net   1555 ml     HEENT Frail CF CHEST cta b, no added sound CARDS s1 s2 no m/r/g ABD soft, nt/nd NEURO grossly intact, has a bandage on the  SKIN has a cast on the RLE and bandage on the Hip  Lab Results:   Studies/Results: Dg Hip Portable 1 View Right  04/03/2011  *RADIOLOGY REPORT*  Clinical Data: Postop right hip  PORTABLE RIGHT HIP - 1 VIEW  Comparison: 08/15/2010  Findings: A single portable view of the right hip is limited by underpenetration of the film.  However, this appears to demonstrate a right-sided prosthesis. The acetabular component appears properly located, as does the femoral component.  The prosthetic femoral head projects over the acetabular component.  Surgical staples are noted overlying the right buttock region.  The bony pelvis is poorly visualized.  IMPRESSION:  1.  Expected postoperative appearance of right total hip arthroplasty, as above, without immediate complicating features.  Original Report Authenticated By: Florencia Reasons, M.D.

## 2011-04-04 NOTE — Progress Notes (Signed)
Dr. Darrelyn Hillock notified of decreased urine output of 20 ccs and BP low 78/42 manual pulse 90. Pts IV rate increased to 125 with a 125 bolus as ordered.

## 2011-04-04 NOTE — Progress Notes (Signed)
CRITICAL MB OF 20.7 REPORTED TO REBECCA CULP, RN

## 2011-04-04 NOTE — Op Note (Signed)
NAMEBRESLIN, BURKLOW NO.:  [id number]  MEDICAL RECORD NO.:  [id number]  LOCATION:  1608                         FACILITY:  [facility]  PHYSICIAN:  Georges Lynch. Annalyssa Thune, M.D.DATE OF BIRTH:  08-May-1934  DATE OF PROCEDURE:  04/03/2011 DATE OF DISCHARGE:                              OPERATIVE REPORT   SURGEON:  Georges Lynch. Darrelyn Hillock, M.D.  ASSISTANT:  Madlyn Frankel. Charlann Boxer, M.D. and [place], [place]  PREOPERATIVE DIAGNOSES: 1. Severe degenerative arthritis compounded with avascular necrosis of     the right femoral head, and this lady basically was bedridden     because of her hip issue. 2. Morbid obesity. This is a complex surgical case.  OPERATION:  A right total hip arthroplasty.  The cup inserted was a 52 mm outside diameter, 36 mm inside diameter; this is the AltrX liner, +4 neutral.  We used a hole eliminator as I mentioned.  The stem was a size 8 standard stem, Tri-Lock stem.  The cup was a pinnacle cup, outside diameter was 52 mm.  We utilized one cancellous  screw.  The femoral head was a +1.5, 36 mm diameter metal femoral head.  PROCEDURE IN DETAIL:  Under general anesthesia, a routine orthopedic prep and draping of the right hip was carried out with the patient on the left side.  The appropriate time-out was carried out first before any incision.  Also I marked the appropriate right leg in the holding area.  At this time, the patient had 2 g of IV Ancef.  She also had 400 mg of Cipro because of the consistency of her urine.  Note, this was an extremely highlighted complex case because of her body weight.  Bleeders were identified and cauterized after the incision was made.  Self- retaining retractors were inserted.  At this particular time, I incised the iliotibial band.  I partially detached the external rotators.  I did a capsulectomy, and we then removed the femoral head with the oscillating saw  after we dislocated the head.  We removed a large bursa on  the acetabulum as well.  Note, the head was extremely deformed. Following this, I utilized the box osteotome followed by the widening reamer followed by the canal finer down into the femoral canal.  We thoroughly irrigated the canal on multiple occasions.  At this particular time, we then rasped the canal up to a size 8.  Note, we had to cut the neck back down a little more at the initial cut because of the difficulty in reducing the hip later on.  At this particular point, after the femoral canal was prepared, we packed with a large sponge, which we later removed.  We then prepared the acetabulum, removed a large spur from the inferior portion of the acetabulum.  We did a nice capsulectomy and reamed the acetabulum up to 51 mm for a 52 mm cup.  We inserted the permanent 52 mm pinnacle cup with 1 screw.  A drill hole was made in the acetabulum for the screw fixation.  We then inserted our permanent AltrX cup.  We then went on and inserted our permanent Tri- Lock stem, standard size 8.  We  tried several different lengths of balls and we finally had settle for the 1.5 mm in length metal ball, which we then inserted and put through motion again, had excellent stability.  At all time, we tried our best to do the appropriate leg length as well as the stability of the hip.  After the hip was reduced, we thoroughly irrigated out the area.  I inserted thrombin-soaked Gelfoam in the inferior capsular region.  I injected 40 cc of mixture of 20 cc of Exparel with 20 cc of normal saline.  I then inserted Hemovac drain and closed the wound layers in usual fashion.  Sterile dressings were applied.  The patient left the operative room in satisfactory condition.          ______________________________ Georges Lynch Darrelyn Hillock, M.D.     RAG/MEDQ  D:  04/03/2011  T:  04/04/2011  Job:  [id number]

## 2011-04-05 DIAGNOSIS — D62 Acute posthemorrhagic anemia: Secondary | ICD-10-CM | POA: Diagnosis not present

## 2011-04-05 LAB — HEMOGLOBIN AND HEMATOCRIT, BLOOD
HCT: 25.8 % — ABNORMAL LOW (ref 36.0–46.0)
Hemoglobin: 9 g/dL — ABNORMAL LOW (ref 12.0–15.0)

## 2011-04-05 LAB — BASIC METABOLIC PANEL
BUN: 31 mg/dL — ABNORMAL HIGH (ref 6–23)
CO2: 21 mEq/L (ref 19–32)
Chloride: 103 mEq/L (ref 96–112)
Glucose, Bld: 119 mg/dL — ABNORMAL HIGH (ref 70–99)
Potassium: 4.6 mEq/L (ref 3.5–5.1)
Sodium: 131 mEq/L — ABNORMAL LOW (ref 135–145)

## 2011-04-05 LAB — URINE CULTURE: Culture: NO GROWTH

## 2011-04-05 LAB — PREPARE RBC (CROSSMATCH)

## 2011-04-05 LAB — GLUCOSE, CAPILLARY
Glucose-Capillary: 107 mg/dL — ABNORMAL HIGH (ref 70–99)
Glucose-Capillary: 96 mg/dL (ref 70–99)
Glucose-Capillary: 89 mg/dL (ref 70–99)

## 2011-04-05 LAB — CBC
HCT: 22.3 % — ABNORMAL LOW (ref 36.0–46.0)
Hemoglobin: 7.5 g/dL — ABNORMAL LOW (ref 12.0–15.0)
RBC: 2.37 MIL/uL — ABNORMAL LOW (ref 3.87–5.11)

## 2011-04-05 MED ORDER — HYDROCODONE-ACETAMINOPHEN 5-325 MG PO TABS
1.0000 | ORAL_TABLET | ORAL | Status: DC
  Administered 2011-04-05 – 2011-04-06 (×6): 1 via ORAL
  Administered 2011-04-07: 2 via ORAL
  Administered 2011-04-07 – 2011-04-09 (×11): 1 via ORAL
  Filled 2011-04-05: qty 2
  Filled 2011-04-05 (×4): qty 1
  Filled 2011-04-05: qty 2
  Filled 2011-04-05 (×12): qty 1

## 2011-04-05 MED ORDER — RIVAROXABAN 10 MG PO TABS
10.0000 mg | ORAL_TABLET | Freq: Every day | ORAL | Status: DC
  Filled 2011-04-05: qty 1

## 2011-04-05 MED ORDER — FUROSEMIDE 20 MG PO TABS
20.0000 mg | ORAL_TABLET | Freq: Once | ORAL | Status: AC
  Administered 2011-04-05: 20 mg via ORAL
  Filled 2011-04-05: qty 1

## 2011-04-05 MED ORDER — ENOXAPARIN SODIUM 30 MG/0.3ML ~~LOC~~ SOLN
30.0000 mg | SUBCUTANEOUS | Status: DC
  Administered 2011-04-05: 30 mg via SUBCUTANEOUS
  Filled 2011-04-05 (×2): qty 0.3

## 2011-04-05 MED ORDER — LACTATED RINGERS IV SOLN
INTRAVENOUS | Status: DC
  Administered 2011-04-04 – 2011-04-06 (×4): via INTRAVENOUS

## 2011-04-05 NOTE — Progress Notes (Signed)
PT NOTE  Pt still on bedrest, Hgb 7.5 today.  Will obviously defer until activity status updated.

## 2011-04-05 NOTE — Progress Notes (Signed)
ANTICOAGULATION CONSULT NOTE - Initial Consult  Pharmacy Consult for Lovenox Indication: VTE Prophylaxis s/p THA  Allergies  Allergen Reactions  . Mirtazapine     Patient Measurements: Height: 5\' 5"  (165.1 cm) Weight: 191 lb (86.637 kg) IBW/kg (Calculated) : 57    Vital Signs: Temp: 98.5 F (36.9 C) (02/07 0509) Temp src: Oral (02/07 0509) BP: 100/58 mmHg (02/07 0509) Pulse Rate: 82  (02/07 0509)  Labs:  Basename 04/05/11 0347 04/04/11 1624 04/04/11 1434 04/04/11 1215 04/04/11 0440 04/03/11 1730  HGB 7.5* 8.6* -- -- -- --  HCT 22.3* 25.3* -- 27.1* -- --  PLT 125* -- -- -- 183 --  APTT -- -- -- -- -- --  LABPROT -- -- -- -- -- --  INR -- -- -- -- -- --  HEPARINUNFRC -- -- -- -- -- --  CREATININE 1.84* -- -- -- 1.55* 1.32*  CKTOTAL -- -- 781* -- -- --  CKMB -- -- 20.7* -- -- --  TROPONINI -- -- <0.30 -- -- --   Estimated Creatinine Clearance: 29.1 ml/min (by C-G formula based on Cr of 1.84).  Medical History: Past Medical History  Diagnosis Date  . Rhabdomyolysis 08-15-2010  . Acute renal failure 08-15-2010 secondary to rhabdomylosis  . Hypertension   . Diabetes mellitus   . Asthma   . Hypercholesteremia   . Thrombocytopenia   . Primary osteoarthritis of right hip   . DEMENTIA     Medications:  Scheduled:    .  ceFAZolin (ANCEF) IV  1 g Intravenous Q6H  . ciprofloxacin  500 mg Oral BID  . enoxaparin (LOVENOX) injection  30 mg Subcutaneous Q24H  . ferrous sulfate  325 mg Oral TID PC  . HYDROcodone-acetaminophen  2 tablet Oral Q4H  . insulin aspart  0-15 Units Subcutaneous TID WC  . lactated ringers  1,000 mL Intravenous Once  . DISCONTD:  ceFAZolin (ANCEF) IV  1 g Intravenous Q6H  . DISCONTD: ciprofloxacin  500 mg Oral BID  . DISCONTD: olmesartan  40 mg Oral QPC breakfast  . DISCONTD: rivaroxaban  10 mg Oral Q breakfast  . DISCONTD: rivaroxaban  10 mg Oral Daily   Infusions:    . lactated ringers 200 mL/hr at 04/04/11 1311  . lactated ringers 200  mL/hr at 04/05/11 0425   PRN: acetaminophen, acetaminophen, alum & mag hydroxide-simeth, bisacodyl, LORazepam, menthol-cetylpyridinium, methocarbamol(ROBAXIN) IV, methocarbamol, metoCLOPramide (REGLAN) injection, ondansetron (ZOFRAN) IV, ondansetron, phenol, polyethylene glycol, DISCONTD: HYDROcodone-acetaminophen, DISCONTD: HYDROmorphone, DISCONTD: oxyCODONE-acetaminophen  Assessment:  77 y/o s/p R THA 04/03/11.  Was previously on Xarelto for VTE prophylaxis, but serum creatinine is rising and estimated CrCl now 80mL/min.   Use of Xarelto is not recommended when CrCl due to potential for drug accumulation and increased bleeding risk.  Contacted ortho with recommendation to switch Xarelto to low-dose Lovenox while CrCl < 73mL/min.  Orders received.    Plan:   DC Xarelto  Lovenox 30mg  SQ q24h (dosage adjusted for renal insufficiency).  Elie Goody, PharmD, BCPS Pager: [phone] 04/05/2011  9:42 AM

## 2011-04-05 NOTE — Plan of Care (Signed)
Problem: Consults Goal: Total Joint Replacement Patient Education See Patient Education Module for education specifics. Outcome: Progressing Education of family members. Goal: Diagnosis- Total Joint Replacement Outcome: Completed/Met Date Met:  04/05/11 Primary Total Hip Right  Problem: Phase II Progression Outcomes Goal: Discharge plan established Outcome: Completed/Met Date Met:  04/05/11 Return to [facility]

## 2011-04-05 NOTE — Consult Note (Signed)
[facility]ITAL[facility] CONSULT NOTE Subjective- sleepy. Arousable. First unit of blood has been transfused. Son and room. No specific complaints at present.  Her son is her next of kin Gianna Calef can be reached at [phone]-updated him at bedside today  Recommendations Principal Problem:  *Hypotension-? 2/2 to ABLA ?--Likely cause for hypotension is blood loss, given dropping trend. Would give one dose of by mouth Lasix after second unit and we'll place the order.  Anemia-transfusing. Continue ferrous sulfate orally  Active Problems:  Osteoarthritis of right hip-s/p surgery 04/03/11 for AVN on the R hip.  Recommend judicious use of opiates for pain management given her sleepiness continue Lovenox for DVT prophylaxis, given her renal insufficiency precluding use of Xarelto. Would cut back Robaxin unless absolutely indicated given somnolence and only give Ativan as needed.   Morbid obesity-stable   AKI (acute kidney injury) superimposed on CKD stage 2-a BMEt from Jan 25th 2013 = Bun 32/1.12.  Creatinine has increased to 1.8.   Likely etiology is acute tubular nephritis secondary to blood loss anemia causing hypoperfusion of kidney-would decrease fluid rate to 100 cc lactated Ringer per hour after blood given.  Has started pickup her urine output and put out 1.3 L over the course of yesterday to today   CHF-Ef 80% on Echo 02/23/2011, with normal wall motion-stable. Will need outpatient followup and management recommendations for blood pressure meds then with primary care physician. Would not discharge on blood pressure medicine   Pulmonary HTN-Stable. Outpatient followup  H/o Rhabdomyolysis-creatinine kinase only mildly elevated, unlikely secondary to rhabdomyolysis  Diet controlled DM-Stable-blood sugars between 162 and 96. Continue sliding scale insulin for coverage  Hyponatremia-likely secondary to aggressive fluid resuscitation with lactated Ringer. We'll cut back dose of IV  fluids  Unclear why patient getting ciprofloxacin/metoclopramide if no indication would discontinue   Trejan Buda,JAI 04/05/2011, 2:04 PM  CBC    Component Value Date/Time   WBC 7.2 04/05/2011 0347   RBC 2.37* 04/05/2011 0347   HGB 7.5* 04/05/2011 0347   HCT 22.3* 04/05/2011 0347   PLT 125* 04/05/2011 0347   MCV 94.1 04/05/2011 0347   MCH 31.6 04/05/2011 0347   MCHC 33.6 04/05/2011 0347   RDW 12.8 04/05/2011 0347   LYMPHSABS 0.8 08/16/2010 0443   MONOABS 0.6 08/16/2010 0443   EOSABS 0.0 08/16/2010 0443   BASOSABS 0.0 08/16/2010 0443    BMET    Component Value Date/Time   NA 131* 04/05/2011 0347   K 4.6 04/05/2011 0347   CL 103 04/05/2011 0347   CO2 21 04/05/2011 0347   GLUCOSE 119* 04/05/2011 0347   BUN 31* 04/05/2011 0347   CREATININE 1.84* 04/05/2011 0347   CALCIUM 8.0* 04/05/2011 0347   GFRNONAA 26* 04/05/2011 0347   GFRAA 30* 04/05/2011 0347    Medications: Scheduled Meds:    . ciprofloxacin  500 mg Oral BID  . enoxaparin (LOVENOX) injection  30 mg Subcutaneous Q24H  . ferrous sulfate  325 mg Oral TID PC  . HYDROcodone-acetaminophen  2 tablet Oral Q4H  . insulin aspart  0-15 Units Subcutaneous TID WC  . DISCONTD: rivaroxaban  10 mg Oral Q breakfast  . DISCONTD: rivaroxaban  10 mg Oral Daily   Continuous Infusions:    . lactated ringers 200 mL/hr at 04/04/11 1311  . lactated ringers 200 mL/hr at 04/05/11 0425   PRN Meds:.acetaminophen, acetaminophen, alum & mag hydroxide-simeth, bisacodyl, LORazepam, menthol-cetylpyridinium, methocarbamol(ROBAXIN) IV, methocarbamol, metoCLOPramide (REGLAN) injection, ondansetron (ZOFRAN) IV, ondansetron, phenol, polyethylene glycol  Objective: Weight change:  Intake/Output Summary (Last 24 hours) at 04/05/11 1404 Last data filed at 04/05/11 1000  Gross per 24 hour  Intake    300 ml  Output    425 ml  Net   -125 ml     HEENT Frail CF CHEST cta b, no added sound CARDS s1 s2 no m/r/g ABD soft, nt/nd NEURO grossly intact, has a bandage on the  SKIN has  a cast on the RLE and bandage on the Hip  Lab Results:   Studies/Results: Dg Hip Portable 1 View Right  04/03/2011  *RADIOLOGY REPORT*  Clinical Data: Postop right hip  PORTABLE RIGHT HIP - 1 VIEW  Comparison: 08/15/2010  Findings: A single portable view of the right hip is limited by underpenetration of the film.  However, this appears to demonstrate a right-sided prosthesis. The acetabular component appears properly located, as does the femoral component.  The prosthetic femoral head projects over the acetabular component.  Surgical staples are noted overlying the right buttock region.  The bony pelvis is poorly visualized.  IMPRESSION:  1.  Expected postoperative appearance of right total hip arthroplasty, as above, without immediate complicating features.  Original Report Authenticated By: Florencia Reasons, M.D.

## 2011-04-05 NOTE — Progress Notes (Signed)
  CARE MANAGEMENT NOTE 04/05/2011  Patient:  Penny Delgado, Penny Delgado   Account Number:  [id number]  Date Initiated:  04/05/2011  Documentation initiated by:  Colleen Can  Subjective/Objective Assessment:   dx severe degenerative arthritis ,avascular necrosis; total hip arthroplasty     Action/Plan:   Back to facility   Anticipated DC Date:  04/06/2011   Anticipated DC Plan:  SKILLED NURSING FACILITY  In-house referral  Clinical Social Worker      DC Planning Services  NA      [facility] Choice  NA   Choice offered to / List presented to:  NA   DME arranged  NA      DME agency  NA     HH arranged  NA      HH agency  NA   Status of service:  Completed, signed off

## 2011-04-05 NOTE — Progress Notes (Signed)
Subjective: 2 Days Post-Op Procedure(s) (LRB): TOTAL HIP ARTHROPLASTY (Right) Patient reports pain as moderate.   Patient seen in rounds without Dr. Darrelyn Hillock. Patient has complaints of pain and nausea. She states that she is nauseated this morning and does not have much of an appetite. She denies chest pain and shortness of breath. Pt currently on bedrest and not participating in PT.   Objective: Vital signs in last 24 hours: Temp:  [97.3 F (36.3 C)-99.5 F (37.5 C)] 98.5 F (36.9 C) (02/07 0509) Pulse Rate:  [82-96] 82  (02/07 0509) Resp:  [16-18] 16  (02/07 0509) BP: (79-106)/(42-62) 100/58 mmHg (02/07 0509) SpO2:  [94 %-100 %] 100 % (02/07 0509)  Intake/Output from previous day:  Intake/Output Summary (Last 24 hours) at 04/05/11 0741 Last data filed at 04/05/11 0512  Gross per 24 hour  Intake     60 ml  Output    425 ml  Net   -365 ml     Labs:  Basename 04/05/11 0347 04/04/11 1624 04/04/11 1215 04/04/11 0440 04/03/11 1730  HGB 7.5* 8.6* 9.1* 10.5* 12.1    Basename 04/05/11 0347 04/04/11 1624 04/04/11 0440  WBC 7.2 -- 13.6*  RBC 2.37* -- 3.34*  HCT 22.3* 25.3* --  PLT 125* -- 183    Basename 04/05/11 0347 04/04/11 0440  NA 131* 136  K 4.6 5.0  CL 103 104  CO2 21 17*  BUN 31* 32*  CREATININE 1.84* 1.55*  GLUCOSE 119* 198*  CALCIUM 8.0* 9.1   Exam - Neurologically intact Neurovascular intact Dorsiflexion/Plantar flexion intact No cellulitis present Compartment soft Dressing/Incision -  blood tinged drainage, no erythema or warmth Motor function intact - moving foot and toes well on exam.   Past Medical History  Diagnosis Date  . Rhabdomyolysis 08-15-2010  . Acute renal failure 08-15-2010 secondary to rhabdomylosis  . Hypertension   . Diabetes mellitus   . Asthma   . Hypercholesteremia   . Thrombocytopenia   . Primary osteoarthritis of right hip   . DEMENTIA     Assessment/Plan: 2 Days Post-Op Procedure(s) (LRB): TOTAL HIP ARTHROPLASTY  (Right) Principal Problem:  *Hypotension-? 2/2 to ABLA Active Problems:  Osteoarthritis of right hip  Morbid obesity  AKI (acute kidney injury)  CHF-Ef 60-65% on Echo 2004  Pulmonary HTN   Advance diet Discharge to SNF when goals are met Transfuse 2 units today. No therapy due to transfusion today. Reinforce dressing as needed.   DVT Prophylaxis - Xarelto Protocol Partial-Weight Bearing 25-50% right leg  Nolin Grell [name] 04/05/2011, 7:41 AM

## 2011-04-05 NOTE — Progress Notes (Signed)
Subjective: Acute loss anemia. She will receive her second unit of packed RBC.Her Hbg was 7.5.We are following her Renal Profile as well.   Objective: Vital signs in last 24 hours: Temp:  [97.1 F (36.2 C)-98.9 F (37.2 C)] 98.9 F (37.2 C) (02/07 1120) Pulse Rate:  [79-89] 81  (02/07 1120) Resp:  [16-18] 16  (02/07 1120) BP: (90-106)/(44-62) 100/44 mmHg (02/07 1120) SpO2:  [94 %-100 %] 100 % (02/07 0509)  Intake/Output from previous day: 02/06 0701 - 02/07 0700 In: 60 [P.O.:60] Out: 425 [Urine:425] Intake/Output this shift: Total I/O In: 240 [P.O.:240] Out: -    Basename 04/05/11 0347 04/04/11 1624 04/04/11 1215 04/04/11 0440 04/03/11 1730  HGB 7.5* 8.6* 9.1* 10.5* 12.1    Basename 04/05/11 0347 04/04/11 1624 04/04/11 0440  WBC 7.2 -- 13.6*  RBC 2.37* -- 3.34*  HCT 22.3* 25.3* --  PLT 125* -- 183    Basename 04/05/11 0347 04/04/11 0440  NA 131* 136  K 4.6 5.0  CL 103 104  CO2 21 17*  BUN 31* 32*  CREATININE 1.84* 1.55*  GLUCOSE 119* 198*  CALCIUM 8.0* 9.1   No results found for this basename: LABPT:2,INR:2 in the last 72 hours  Neurologically intact  Assessment/Plan: Bed rest until she receives her last uniy of RBC.   Jamyiah Labella A 04/05/2011, 1:46 PM

## 2011-04-05 NOTE — Progress Notes (Signed)
Attempted Lt arm PICC without success.   Basilic and cephalic attempted.  Veins very small.  Difficult to access then unable to thread guidewires.    Attempted to insert PIV using ultrasound for a back up site in case one blood is infusing into now stops working.    Unsuccessful with PIV.   Primary RN, Mardella Layman made aware.   I contacted IR and spoke with Olegario Messier.   Made aware of inability to insert PICC.  Referred to IR.   They will attempt to work her in late this evening around 3:30   or later or may be in the morning before they can get to her.

## 2011-04-06 ENCOUNTER — Inpatient Hospital Stay (HOSPITAL_COMMUNITY): Payer: Medicare Other

## 2011-04-06 LAB — TYPE AND SCREEN
ABO/RH(D): A POS
Antibody Screen: NEGATIVE
Unit division: 0
Unit division: 0

## 2011-04-06 LAB — CBC
MCV: 93.1 fL (ref 78.0–100.0)
Platelets: 120 10*3/uL — ABNORMAL LOW (ref 150–400)
RDW: 14.1 % (ref 11.5–15.5)
WBC: 6.3 10*3/uL (ref 4.0–10.5)

## 2011-04-06 LAB — BASIC METABOLIC PANEL
BUN: 29 mg/dL — ABNORMAL HIGH (ref 6–23)
CO2: 21 mEq/L (ref 19–32)
Calcium: 8.2 mg/dL — ABNORMAL LOW (ref 8.4–10.5)
Chloride: 105 mEq/L (ref 96–112)
Creatinine, Ser: 1.52 mg/dL — ABNORMAL HIGH (ref 0.50–1.10)
GFR calc Af Amer: 38 mL/min — ABNORMAL LOW (ref >90)
GFR calc non Af Amer: 33 mL/min — ABNORMAL LOW (ref >90)
Glucose, Bld: 107 mg/dL — ABNORMAL HIGH (ref 70–99)
Potassium: 4.5 mEq/L (ref 3.5–5.1)
Sodium: 135 mEq/L (ref 135–145)

## 2011-04-06 LAB — GLUCOSE, CAPILLARY
Glucose-Capillary: 108 mg/dL — ABNORMAL HIGH (ref 70–99)
Glucose-Capillary: 113 mg/dL — ABNORMAL HIGH (ref 70–99)

## 2011-04-06 MED ORDER — ENOXAPARIN SODIUM 40 MG/0.4ML ~~LOC~~ SOLN
40.0000 mg | SUBCUTANEOUS | Status: DC
  Administered 2011-04-06 – 2011-04-08 (×3): 40 mg via SUBCUTANEOUS
  Filled 2011-04-06 (×4): qty 0.4

## 2011-04-06 MED ORDER — CEPHALEXIN 500 MG PO CAPS
500.0000 mg | ORAL_CAPSULE | Freq: Four times a day (QID) | ORAL | Status: DC
  Administered 2011-04-06 – 2011-04-09 (×13): 500 mg via ORAL
  Filled 2011-04-06 (×16): qty 1

## 2011-04-06 NOTE — Progress Notes (Signed)
Subjective: Dioing Well.Hbg is stable.BUN and Creatinine is stabilizing. Output is remarkably improved. Dressing changed and wound looks fine.  Objective: Vital signs in last 24 hours: Temp:  [97.1 F (36.2 C)-98.9 F (37.2 C)] 97.4 F (36.3 C) (02/08 0626) Pulse Rate:  [73-82] 76  (02/08 0626) Resp:  [14-16] 14  (02/08 0626) BP: (90-113)/(44-63) 113/63 mmHg (02/08 0626) SpO2:  [98 %-100 %] 98 % (02/08 0626)  Intake/Output from previous day: 02/07 0701 - 02/08 0700 In: 2835 [P.O.:960; I.V.:1231.7; Blood:643.3] Out: 2775 [Urine:2775] Intake/Output this shift:     Basename 04/06/11 0425 04/05/11 1920 04/05/11 0347 04/04/11 1624 04/04/11 1215  HGB 10.3* 9.0* 7.5* 8.6* 9.1*    Basename 04/06/11 0425 04/05/11 1920 04/05/11 0347  WBC 6.3 -- 7.2  RBC 3.19* -- 2.37*  HCT 29.7* 25.8* --  PLT 120* -- 125*    Basename 04/06/11 0425 04/05/11 0347  NA 135 131*  K 4.5 4.6  CL 105 103  CO2 21 21  BUN 29* 31*  CREATININE 1.52* 1.84*  GLUCOSE 107* 119*  CALCIUM 8.2* 8.0*   No results found for this basename: LABPT:2,INR:2 in the last 72 hours  No cellulitis present Wound looks fine.  Assessment/Plan: DC to SNF when stable.   Mae Denunzio A 04/06/2011, 7:08 AM

## 2011-04-06 NOTE — Progress Notes (Signed)
[facility] contacted this am. SNF does not accept week end admissions unless D/C Summary is faxed to them prior to 4pm Fri. Info communicated to progression nurse to alert MD incase he was considering week end d/c. [facility] will have a bed available on Monday for this pt if stable. CSW will follow to assist with d/c planning needs.

## 2011-04-06 NOTE — Progress Notes (Signed)
dinamap blood pressure 83/63, rechecked using manual cuff, 108/54. Patient asymptomatic. Encouraged fluids. Will monitor.

## 2011-04-06 NOTE — Consult Note (Signed)
[facility] CONSULT NOTE Subjective- pain 8/10.  No specific complaints at present.  Her son is her next of kin Penny Delgado can be reached at [phone] Called son today to update-  Recommendations Principal Problem: Hypotension-? 2/2 to ABLA ?--Resolved.  Start Benicar 5-10 mg on discharge.   Anemia-received 2 units packed red blood cells 2/7.  Continue iron.    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 Per orthopedic.    Morbid obesity-stable  AKI (acute kidney injury) superimposed on CKD stage 2-secondary to hypertension. Creatinine peaked at 1.8 and slowly resolving. Discontinue IV fluids.  CHF-Ef 80% on Echo 02/23/2011, with normal wall motion-stable. Will need outpatient followup and management recommendations for blood pressure meds then with primary care physician.   Pulmonary HTN-Stable. Outpatient followup  Diet controlled DM-Stable-blood sugars between 108-99.  Continue sliding scale insulin for coverage.  Would not discharge home on any meds.  Hyponatremia-resolved. No need for further fluids.  Repeat be met as outpatient  Patient medically stable for transfer to nursing facility. No specific medical needs at this time. I will sign off of her medical care thank you for this consult  Shonya Sumida,JAI 04/06/2011, 11:36 AM  CBC    Component Value Date/Time   WBC 6.3 04/06/2011 0425   RBC 3.19* 04/06/2011 0425   HGB 10.3* 04/06/2011 0425   HCT 29.7* 04/06/2011 0425   PLT 120* 04/06/2011 0425   MCV 93.1 04/06/2011 0425   MCH 32.3 04/06/2011 0425   MCHC 34.7 04/06/2011 0425   RDW 14.1 04/06/2011 0425   LYMPHSABS 0.8 08/16/2010 0443   MONOABS 0.6 08/16/2010 0443   EOSABS 0.0 08/16/2010 0443   BASOSABS 0.0 08/16/2010 0443    BMET    Component Value Date/Time   NA 135 04/06/2011 0425   K 4.5 04/06/2011 0425   CL 105 04/06/2011 0425   CO2 21 04/06/2011 0425   GLUCOSE 107* 04/06/2011 0425   BUN 29* 04/06/2011 0425   CREATININE 1.52* 04/06/2011  0425   CALCIUM 8.2* 04/06/2011 0425   GFRNONAA 33* 04/06/2011 0425   GFRAA 38* 04/06/2011 0425    Medications: Scheduled Meds:    . cephALEXin  500 mg Oral Q6H  . enoxaparin (LOVENOX) injection  40 mg Subcutaneous Q24H  . ferrous sulfate  325 mg Oral TID PC  . furosemide  20 mg Oral Once  . HYDROcodone-acetaminophen  1-2 tablet Oral Q4H  . insulin aspart  0-15 Units Subcutaneous TID WC  . DISCONTD: ciprofloxacin  500 mg Oral BID  . DISCONTD: enoxaparin (LOVENOX) injection  30 mg Subcutaneous Q24H  . DISCONTD: HYDROcodone-acetaminophen  2 tablet Oral Q4H   Continuous Infusions:    . DISCONTD: lactated ringers 100 mL/hr at 04/06/11 0328   PRN Meds:.alum & mag hydroxide-simeth, bisacodyl, LORazepam, menthol-cetylpyridinium, methocarbamol(ROBAXIN) IV, methocarbamol, metoCLOPramide (REGLAN) injection, ondansetron (ZOFRAN) IV, ondansetron, phenol, polyethylene glycol, DISCONTD: acetaminophen, DISCONTD: acetaminophen  Objective: Weight change:   Intake/Output Summary (Last 24 hours) at 04/06/11 1136 Last data filed at 04/06/11 1914  Gross per 24 hour  Intake 3101.67 ml  Output   2775 ml  Net 326.67 ml     HEENT Frail CF CHEST cta b, no added sound CARDS s1 s2 no m/r/g ABD soft, nt/nd NEURO grossly intact, has a bandage on the  SKIN has a cast on the RLE and bandage on the Hip  Lab Results:   Studies/Results: Dg Hip Portable 1 View Right  04/03/2011  *RADIOLOGY REPORT*  Clinical Data: Postop right hip  PORTABLE RIGHT HIP - 1 VIEW  Comparison: 08/15/2010  Findings: A single portable view of the right hip is limited by underpenetration of the film.  However, this appears to demonstrate a right-sided prosthesis. The acetabular component appears properly located, as does the femoral component.  The prosthetic femoral head projects over the acetabular component.  Surgical staples are noted overlying the right buttock region.  The bony pelvis is poorly visualized.  IMPRESSION:  1.   Expected postoperative appearance of right total hip arthroplasty, as above, without immediate complicating features.  Original Report Authenticated By: Florencia Reasons, M.D.

## 2011-04-06 NOTE — Progress Notes (Signed)
Per Dr Darrelyn Hillock on AM rounds pt does not need a PICC line as she is nearing D/C. Dr Mahala Menghini paged and spoke with him, he is in agreement that pt will not need a PICC line.

## 2011-04-06 NOTE — Progress Notes (Signed)
ANTICOAGULATION CONSULT NOTE - Initial Consult  Pharmacy Consult for Lovenox Indication: VTE Prophylaxis s/p THA  Allergies  Allergen Reactions  . Mirtazapine     Patient Measurements: Height: 5\' 5"  (165.1 cm) Weight: 191 lb (86.637 kg) IBW/kg (Calculated) : 57    Vital Signs: Temp: 97.4 F (36.3 C) (02/08 0626) BP: 113/63 mmHg (02/08 0626) Pulse Rate: 76  (02/08 0626)  Labs:  Basename 04/06/11 0425 04/05/11 1920 04/05/11 0347 04/04/11 1434 04/04/11 0440  HGB 10.3* 9.0* -- -- --  HCT 29.7* 25.8* 22.3* -- --  PLT 120* -- 125* -- 183  APTT -- -- -- -- --  LABPROT -- -- -- -- --  INR -- -- -- -- --  HEPARINUNFRC -- -- -- -- --  CREATININE 1.52* -- 1.84* -- 1.55*  CKTOTAL -- -- -- 781* --  CKMB -- -- -- 20.7* --  TROPONINI -- -- -- <0.30 --   Estimated Creatinine Clearance: 35.3 ml/min (by C-G formula based on Cr of 1.52).  Medical History: Past Medical History  Diagnosis Date  . Rhabdomyolysis 08-15-2010  . Acute renal failure 08-15-2010 secondary to rhabdomylosis  . Hypertension   . Diabetes mellitus   . Asthma   . Hypercholesteremia   . Thrombocytopenia   . Primary osteoarthritis of right hip   . DEMENTIA     Medications:  Scheduled:     . cephALEXin  500 mg Oral Q6H  . enoxaparin (LOVENOX) injection  30 mg Subcutaneous Q24H  . ferrous sulfate  325 mg Oral TID PC  . furosemide  20 mg Oral Once  . HYDROcodone-acetaminophen  1-2 tablet Oral Q4H  . insulin aspart  0-15 Units Subcutaneous TID WC  . DISCONTD: ciprofloxacin  500 mg Oral BID  . DISCONTD: HYDROcodone-acetaminophen  2 tablet Oral Q4H  . DISCONTD: rivaroxaban  10 mg Oral Daily   Infusions:     . lactated ringers 100 mL/hr at 04/06/11 0328   PRN: alum & mag hydroxide-simeth, bisacodyl, LORazepam, menthol-cetylpyridinium, methocarbamol(ROBAXIN) IV, methocarbamol, metoCLOPramide (REGLAN) injection, ondansetron (ZOFRAN) IV, ondansetron, phenol, polyethylene glycol, DISCONTD: acetaminophen,  DISCONTD: acetaminophen  Assessment:  77 y/o s/p R THA 04/03/11.  Was previously on Xarelto for VTE prophylaxis, changed to Lovenox after patient developed AKI superimposed on CKD with estimated CrCl < 73mL/min.   SCr improved, CrCl now 62mL/min    Plan:   Increase Lovenox to standard prophylactic dosage (40mg  SQ q24h)  If CrCl stabilizes above 62mL/min, could DC Lovenox and resume Xarelto - decision per ortho and Control and instrumentation engineer.  Elie Goody, PharmD, BCPS Pager: [phone] 04/06/2011  8:44 AM

## 2011-04-06 NOTE — Evaluation (Signed)
Physical Therapy Evaluation Patient Details Name: Penny Delgado MRN: [id number] DOB: [birth year] Today's Date: 04/06/2011  Problem List:  Patient Active Problem List  Diagnoses  . Osteoarthritis of right hip  . Morbid obesity  . AKI (acute kidney injury)  . Hypotension-? 2/2 to ABLA  . CHF-Ef 60-65% on Echo 2004  . Pulmonary HTN  . Anemia due to blood loss, acute    Past Medical History:  Past Medical History  Diagnosis Date  . Rhabdomyolysis 08-15-2010  . Acute renal failure 08-15-2010 secondary to rhabdomylosis  . Hypertension   . Diabetes mellitus   . Asthma   . Hypercholesteremia   . Thrombocytopenia   . Primary osteoarthritis of right hip   . DEMENTIA    Past Surgical History:  Past Surgical History  Procedure Date  . Bilateral knee arthroplasty one done march 2004    2004  . Back surgery  15 yrs ago    not sure upper or lower back  . Cholecystectomy yrs ago  . Abdominal hysterectomy yrs ago    PT Assessment/Plan/Recommendation PT Assessment Clinical Impression Statement: Pt s/p R THR.  Pt would benefit from acute PT services in order to improve overall strength and become more independent with bed mobility and transfers.  Pt very deconditioned and states mainly bed rest since June.  PT Recommendation/Assessment: Patient will need skilled PT in the acute care venue PT Problem List: Decreased strength;Decreased activity tolerance;Decreased mobility;Decreased knowledge of precautions;Pain;Obesity;Decreased knowledge of use of DME PT Therapy Diagnosis : Generalized weakness;Acute pain PT Plan PT Frequency: Min 5X/week PT Treatment/Interventions: DME instruction;Functional mobility training;Therapeutic activities;Therapeutic exercise;Patient/family education;Neuromuscular re-education;Wheelchair mobility training PT Recommendation Follow Up Recommendations: Skilled nursing facility Equipment Recommended: Defer to next venue PT Goals  Acute Rehab PT Goals PT Goal  Formulation: With patient Time For Goal Achievement: 2 weeks Pt will go Supine/Side to Sit: with mod assist PT Goal: Supine/Side to Sit - Progress: Goal set today Pt will go Sit to Supine/Side: with mod assist PT Goal: Sit to Supine/Side - Progress: Goal set today Pt will go Sit to Stand: with +2 total assist;Other (comment) (attempt to stand 3 times) PT Goal: Sit to Stand - Progress: Goal set today Pt will go Stand to Sit: with +2 total assist;Other (comment) (attempt to stand 3 times) PT Goal: Stand to Sit - Progress: Goal set today Pt will Perform Home Exercise Program: with min assist PT Goal: Perform Home Exercise Program - Progress: Goal set today Pt will Propel Wheelchair: 10 - 50 feet;with mod assist PT Goal: Propel Wheelchair - Progress: Goal set today  PT Evaluation Precautions/Restrictions  Precautions Precautions: Posterior Hip;Fall Prior Functioning  Home Living Type of Home: Skilled Nursing Facility Additional Comments: Pt has been living in SNF since June and prior to June was living with spouse. Prior Function Level of Independence: Needs assistance with tranfers Comments: Pt reports she has been in bed since June and denies even sitting EOB. Cognition Cognition Arousal/Alertness: Awake/alert Overall Cognitive Status: Appears within functional limits for tasks assessed Sensation/Coordination Sensation Light Touch: Appears Intact Extremity Assessment RUE Strength RUE Overall Strength Comments: functional weakness observed LUE Strength LUE Overall Strength Comments: functional weakness observed RLE Strength RLE Overall Strength Comments: grossly 2+/5 at best LLE Strength LLE Overall Strength Comments: grossly 2+/5 at best Mobility (including Balance) Bed Mobility Bed Mobility: Yes Supine to Sit: Patient percentage (comment);1: +2 Total assist;HOB elevated (Comment degrees) Supine to Sit Details (indicate cue type and reason): pt=0%, encouraged pt to assist  as much as possible with UEs but pt did not attempt Sit to Supine: Patient percentage (comment);1: +2 Total assist;HOB elevated (comment degrees) Sit to Supine - Details (indicate cue type and reason): pt=0% Transfers Transfers: No (too weak) Ambulation/Gait Ambulation/Gait: No  Balance Balance Assessed: Yes Static Sitting Balance Static Sitting - Balance Support: Bilateral upper extremity supported;Feet supported Static Sitting - Level of Assistance: 5: Stand by assistance Static Sitting - Comment/# of Minutes: able to sit EOB 2 minutes Exercise    End of Session PT - End of Session Activity Tolerance: Patient limited by pain Patient left: in bed;with call bell in reach Nurse Communication: Need for lift equipment;Weight bearing status General Behavior During Session: [facility] for tasks performed Cognition: [facility] for tasks performed  Vaibhav Fogleman,KATHrine E 04/06/2011, 12:39 PM Pager: [phone]

## 2011-04-07 LAB — GLUCOSE, CAPILLARY: Glucose-Capillary: 102 mg/dL — ABNORMAL HIGH (ref 70–99)

## 2011-04-07 NOTE — Progress Notes (Signed)
Physical Therapy Treatment Patient Details Name: Penny Delgado MRN: [id number] DOB: 30-Mar-1934 Today's Date: 04/07/2011  PT Assessment/Plan  PT - Assessment/Plan Comments on Treatment Session: pt is not mobilizing well enough to stand and pivot to bed so used maximove. PT Plan: Discharge plan remains appropriate PT Frequency: Min 5X/week Follow Up Recommendations: Skilled nursing facility Equipment Recommended: Defer to next venue PT Goals  Acute Rehab PT Goals PT Goal Formulation: With patient Time For Goal Achievement: 2 weeks Pt will Roll Supine to Right Side: with mod assist PT Goal: Rolling Supine to Right Side - Progress: Progressing toward goal Pt will Roll Supine to Left Side: with mod assist PT Goal: Rolling Supine to Left Side - Progress: Progressing toward goal  PT Treatment Precautions/Restrictions  Precautions Precautions: Posterior Hip;Fall Restrictions Weight Bearing Restrictions: No Mobility (including Balance)  Transfers Transfers:  (utilized maximove to safely return pt to bed) Ambulation/Gait Ambulation/Gait:  (pt is not ready to attempt)    Exercise    End of Session PT - End of Session Activity Tolerance: Patient tolerated treatment well Patient left: in bed;with call bell in reach Nurse Communication: Need for lift equipment;Weight bearing status General Behavior During Session: [facility] for tasks performed Cognition: [facility] for tasks performed  [name] 04/07/2011, 4:06 PM

## 2011-04-07 NOTE — Progress Notes (Signed)
Patient ID: Penny Delgado, female   DOB: [birth year], 77 y.o.   MRN: [id number] Alert and comfortable.  Hgb 10.3 yesterday-- will repeat 2/10

## 2011-04-07 NOTE — Progress Notes (Addendum)
Physical Therapy Treatment Patient Details Name: Penny Delgado MRN: [id number] DOB: 12/05/1934 Today's Date: 04/07/2011  PT Assessment/Plan  PT - Assessment/Plan Comments on Treatment Session: pt with sign. pain with rolling. pt is unable to assist enough to safely attempt a transfer so maximove utilzed for safety. pt has sever pain of R hip with rom also. Pt yells out in pain, pt was premedicated. Ice applied.pt with noted very reddened and dark R heel. RN notified. Recommend airoverlay for skin as pt does not move in bed and is high risk for skin breakdown. PT Plan: Discharge plan remains appropriate PT Frequency: Min 5X/week Follow Up Recommendations: Skilled nursing facility Equipment Recommended: Defer to next venue PT Goals  Acute Rehab PT Goals PT Goal Formulation: With patient Time For Goal Achievement: 2 weeks Pt will Roll Supine to Right Side: with mod assist PT Goal: Rolling Supine to Right Side - Progress: Goal set today Pt will Roll Supine to Left Side: with mod assist PT Goal: Rolling Supine to Left Side - Progress: Goal set today  PT Treatment Precautions/Restrictions  Precautions Precautions: Posterior Hip;Fall Restrictions Weight Bearing Restrictions: No Mobility (including Balance) Bed Mobility Bed Mobility: Yes Rolling Right: 1: +2 Total assist Rolling Right Details (indicate cue type and reason): pillow between legs for precautions, tc to reach for rails, pt= 20% Rolling Left: 1: +2 Total assist Rolling Left Details (indicate cue type and reason): tc tor each for rail pt=20 Transfers Transfers: No Ambulation/Gait Ambulation/Gait:  (pt is not ready to attempt)    Exercise    End of Session General Behavior During Session: [facility] for tasks performed Cognition: [facility] for tasks performed  Penny Delgado 04/07/2011, 4:01 PM

## 2011-04-08 LAB — GLUCOSE, CAPILLARY
Glucose-Capillary: 87 mg/dL (ref 70–99)
Glucose-Capillary: 114 mg/dL — ABNORMAL HIGH (ref 70–99)
Glucose-Capillary: 99 mg/dL (ref 70–99)

## 2011-04-08 NOTE — Progress Notes (Signed)
Subjective: 5 Days Post-Op Procedure(s) (LRB): TOTAL HIP ARTHROPLASTY (Right) Patient reports pain as mild.    Objective: Vital signs in last 24 hours: Temp:  [97.1 F (36.2 C)-97.7 F (36.5 C)] 97.7 F (36.5 C) (02/10 0559) Pulse Rate:  [84-90] 84  (02/10 0559) Resp:  [18-19] 19  (02/10 0559) BP: (100-122)/(65-80) 110/74 mmHg (02/10 0559) SpO2:  [91 %-97 %] 95 % (02/10 0559)  Intake/Output from previous day: 02/09 0701 - 02/10 0700 In: 1040 [P.O.:1040] Out: 1500 [Urine:1500] Intake/Output this shift:     Basename 04/06/11 0425 04/05/11 1920  HGB 10.3* 9.0*    Basename 04/06/11 0425 04/05/11 1920  WBC 6.3 --  RBC 3.19* --  HCT 29.7* 25.8*  PLT 120* --    Basename 04/06/11 0425  NA 135  K 4.5  CL 105  CO2 21  BUN 29*  CREATININE 1.52*  GLUCOSE 107*  CALCIUM 8.2*   No results found for this basename: LABPT:2,INR:2 in the last 72 hours  Incision: no drainage Wound clean, dry, intact with no erythema. No change neurovascular status No calf tenderness  Assessment/Plan: 5 Days Post-Op Procedure(s) (LRB): TOTAL HIP ARTHROPLASTY (Right) Up with therapy Possible D/C NH tomorrow On Lovenox for DVT prophylaxis  Braxten Memmer A 04/08/2011, 9:09 AM

## 2011-04-08 NOTE — Progress Notes (Signed)
Physical Therapy Treatment Patient Details Name: Penny Delgado MRN: [id number] DOB: November 07, 1934 Today's Date: 04/08/2011  PT Assessment/Plan  PT - Assessment/Plan Comments on Treatment Session: pt has tolerated  standing at [facility] today.  plans SNF PT Plan: Discharge plan remains appropriate PT Frequency: Min 5X/week Follow Up Recommendations: Skilled nursing facility Equipment Recommended: Defer to next venue PT Goals  Acute Rehab PT Goals PT Goal Formulation: With patient Time For Goal Achievement: 2 weeks Pt will Roll Supine to Right Side: with mod assist Pt will go Supine/Side to Sit: with mod assist PT Goal: Supine/Side to Sit - Progress: Progressing toward goal Pt will go Sit to Supine/Side: with mod assist PT Goal: Sit to Supine/Side - Progress: Progressing toward goal Pt will go Sit to Stand: with +2 total assist;Other (comment) PT Goal: Sit to Stand - Progress: Progressing toward goal Pt will go Stand to Sit: with +2 total assist PT Goal: Stand to Sit - Progress: Progressing toward goal Pt will Transfer Bed to Chair/Chair to Bed: with +2 total assist PT Transfer Goal: Bed to Chair/Chair to Bed - Progress: Progressing toward goal  PT Treatment Precautions/Restrictions  Precautions Precautions: Posterior Hip;Fall Restrictions Weight Bearing Restrictions: No Mobility (including Balance) Bed Mobility Sit to Supine: 1: +2 Total assist pt = 0% Sit to Supine - Details (indicate cue type and reason): assisted pt to return to supine pt=0% Transfers Transfers: Yes Sit to Stand: 1: +2 Total assist;From chair/3-in-1 Sit to Stand Details (indicate cue type and reason): vc to stand at RW Stand to Sit: 1: +2 Total assist;To bed Stand to Sit Details: pt lowered  to bed Stand Pivot Transfers: 1: +2 Total assist Stand Pivot Transfer Details (indicate cue type and reason): pt di stand at [facility] and was able to stand long enough to turn to bed, did not take a step pt= 40%     Exercise    End of Session PT - End of Session Activity Tolerance: Patient tolerated treatment well Patient left: in bed;with call bell in reach Nurse Communication: Need for lift equipment;Weight bearing status General Behavior During Session: [facility] for tasks performed Cognition: [facility] for tasks performed  [name] 04/08/2011, 4:01 PM

## 2011-04-08 NOTE — Plan of Care (Signed)
Problem: Phase I Progression Outcomes Goal: [place] evening of surgery Outcome: Not Met (add Reason) Patient bedridden  Problem: Phase II Progression Outcomes Goal: Ambulates Outcome: Not Met (add Reason) Patient bedridden  Problem: Phase III Progression Outcomes Goal: Ambulate BID Outcome: Not Met (add Reason) Patient bedridden- requires lift for transfers

## 2011-04-08 NOTE — Progress Notes (Addendum)
Physical Therapy Treatment Patient Details Name: Penny Delgado MRN: [id number] DOB: Aug 31, 1934 Today's Date: 04/08/2011  PT Assessment/Plan  PT - Assessment/Plan Comments on Treatment Session: pt was able to stand from bed at [facility].  pt tolerated better than 2/9.  pt holds legs very guardedly PT Plan: Discharge plan remains appropriate PT Frequency: Min 5X/week Follow Up Recommendations: Skilled nursing facility Equipment Recommended: Defer to next venue PT Goals  Acute Rehab PT Goals PT Goal Formulation: With patient Time For Goal Achievement: 2 weeks Pt will Roll Supine to Right Side: with mod assist Pt will go Supine/Side to Sit: with mod assist PT Goal: Supine/Side to Sit - Progress: Progressing toward goal Pt will go Sit to Stand: with +2 total assist;Other (comment) (pt=60) PT Goal: Sit to Stand - Progress: Progressing toward goal Pt will go Stand to Sit: with +2 total assist (pt=60) PT Goal: Stand to Sit - Progress: Progressing toward goal Pt will Transfer Bed to Chair/Chair to Bed: with +2 total assist (pt=60) PT Transfer Goal: Bed to Chair/Chair to Bed - Progress: Goal set today  PT Treatment Precautions/Restrictions  Precautions Precautions: Posterior Hip;Fall Restrictions Weight Bearing Restrictions: No Mobility (including Balance) Bed Mobility Supine to Sit: 1: +2 Total assist;HOB elevated (Comment degrees) Supine to Sit Details (indicate cue type and reason): pt= 5 % used bed pad to slide pt around to sitting.  once sitting pt held her balance Transfers Transfers: Yes Sit to Stand: 1: +2 Total assist;From bed;With upper extremity assist Sit to Stand Details (indicate cue type and reason): pt= 50%, pt stood at [facility] with assist. stood for 10 secs.  stood again to pivot Stand to Sit: 1: +2 Total assist;To chair/3-in-1 Stand to Sit Details: pt lowered to chair Stand Pivot Transfers: 1: +2 Total assist Stand Pivot Transfer Details (indicate cue type and reason):  pt=50% to stand, unable to take a step, able to turn pt  while in standing, pt is WBAT    Exercise    End of Session PT - End of Session Activity Tolerance: Patient tolerated treatment well Patient left: in chair;with call bell in reach Nurse Communication: Need for lift equipment;Weight bearing status General Behavior During Session: [facility] for tasks performed Cognition: [facility] for tasks performed  [name] 04/08/2011, 2:14 PM

## 2011-04-09 LAB — GLUCOSE, CAPILLARY: Glucose-Capillary: 88 mg/dL (ref 70–99)

## 2011-04-09 MED ORDER — HYDROCODONE-ACETAMINOPHEN 5-325 MG PO TABS: 1.0000 | ORAL_TABLET | ORAL | Status: AC

## 2011-04-09 MED ORDER — METHOCARBAMOL 500 MG PO TABS: 500.0000 mg | ORAL_TABLET | Freq: Four times a day (QID) | ORAL | Status: AC | PRN

## 2011-04-09 MED ORDER — ENOXAPARIN SODIUM 40 MG/0.4ML ~~LOC~~ SOLN: 40.0000 mg | SUBCUTANEOUS | Status: DC

## 2011-04-09 MED ORDER — CEPHALEXIN 500 MG PO CAPS: 500.0000 mg | ORAL_CAPSULE | Freq: Four times a day (QID) | ORAL | Status: AC

## 2011-04-09 NOTE — Progress Notes (Signed)
Subjective: She is unable to ambulate despite her recent Total Hip. Her obesity and lack of strength has been a real problem. Initially she was totally disabled because of her hip issue but she is very slow in her rehab. Will transfer to SNF.    Objective: Vital signs in last 24 hours: Temp:  [97.3 F (36.3 C)-98.8 F (37.1 C)] 97.9 F (36.6 C) (02/11 0540) Pulse Rate:  [79-88] 79  (02/11 0540) Resp:  [16-20] 16  (02/11 0540) BP: (90-100)/(52-65) 90/57 mmHg (02/11 0540) SpO2:  [96 %-97 %] 97 % (02/11 0540)  Intake/Output from previous day: 02/10 0701 - 02/11 0700 In: 660 [P.O.:660] Out: 1050 [Urine:1050] Intake/Output this shift:    No results found for this basename: HGB:5 in the last 72 hours No results found for this basename: WBC:2,RBC:2,HCT:2,PLT:2 in the last 72 hours No results found for this basename: NA:2,K:2,CL:2,CO2:2,BUN:2,CREATININE:2,GLUCOSE:2,CALCIUM:2 in the last 72 hours No results found for this basename: LABPT:2,INR:2 in the last 72 hours  Neurologically intact Dorsiflexion/Plantar flexion intact  Assessment/Plan: SNF .she needs long term PT.    Penny Delgado A 04/09/2011, 7:14 AM

## 2011-04-09 NOTE — Discharge Summary (Signed)
Physician Discharge Summary   Patient ID: Penny Delgado MRN: [id number] DOB/AGE: 08-03-1934 77 y.o.  Admit date: 04/03/2011 Discharge date: 04/09/2011  Primary Diagnosis Osteoarthritis of right hip AVN right hip  Admission Diagnoses: Past Medical History  Diagnosis Date  . Rhabdomyolysis 08-15-2010  . Acute renal failure 08-15-2010 secondary to rhabdomylosis  . Hypertension   . Diabetes mellitus   . Asthma   . Hypercholesteremia   . Thrombocytopenia   . Primary osteoarthritis of right hip   . DEMENTIA     Discharge Diagnoses:  Principal Problem:  *Hypotension-? 2/2 to ABLA Active Problems:  Osteoarthritis of right hip  Morbid obesity  AKI (acute kidney injury)  CHF-Ef 60-65% on Echo 2004  Pulmonary HTN  Anemia due to blood loss, acute S/P right total hip arthroplasty  Procedure: Procedure(s) (LRB): TOTAL HIP ARTHROPLASTY (Right)   Consults: Internal medicine  HPI: She reported right hip pain that has been present for many years. She is non-ambulatory due to pain and other medical issues. She reported that her pain is primarily in the right groin. X-rays show complete loss of femoral head secondary to AVN. After evaluation, total hip arthroplasty planned in order to decrease pain and increase function.   Labs Results for Penny Delgado (MRN [id number]) as of 04/09/2011 07:28  Ref. Range 08/21/2010 05:45 04/03/2011 17:30 04/04/2011 04:40 04/05/2011 03:47 04/06/2011 04:25  Sodium Latest Range: 135-145 mEq/L 139 138 136 131 (L) 135  Potassium Latest Range: 3.5-5.1 mEq/L 4.3 4.5 5.0 4.6 4.5  Chloride Latest Range: 96-112 mEq/L 105 106 104 103 105  CO2 Latest Range: 19-32 mEq/L 23 21 17  (L) 21 21  BUN Latest Range: 6-23 mg/dL 19 30 (H) 32 (H) 31 (H) 29 (H)  Creat Latest Range: 0.50-1.10 mg/dL 7.82 9.56 (H) 2.13 (H) 1.84 (H) 1.52 (H)  Calcium Latest Range: 8.4-10.5 mg/dL 8.9 9.7 9.1 8.0 (L) 8.2 (L)  GFR calc non Af Amer Latest Range: >90 mL/min >60 39 (L) 32 (L) 26 (L) 33 (L)    GFR calc Af Amer Latest Range: >90 mL/min >60 45 (L) 37 (L) 30 (L) 38 (L)  Glucose Latest Range: 70-99 mg/dL 98 91 086 (H) 578 (H) 469 (H)  Phosphorus Latest Range: 2.3-4.6 mg/dL 4.3      Magnesium Latest Range: 1.5-2.5 mg/dL 1.7       Results for Penny Delgado (MRN [id number]) as of 04/09/2011 07:28  Ref. Range 04/04/2011 12:15 04/04/2011 16:24 04/05/2011 03:47 04/05/2011 19:20 04/06/2011 04:25  WBC Latest Range: 4.0-10.5 K/uL   7.2  6.3  RBC Latest Range: 3.87-5.11 MIL/uL   2.37 (L)  3.19 (L)  HGB Latest Range: 12.0-15.0 g/dL 9.1 (L) 8.6 (L) 7.5 (L) 9.0 (L) 10.3 (L)  HCT Latest Range: 36.0-46.0 % 27.1 (L) 25.3 (L) 22.3 (L) 25.8 (L) 29.7 (L)  MCV Latest Range: 78.0-100.0 fL   94.1  93.1  MCH Latest Range: 26.0-34.0 pg   31.6  32.3  MCHC Latest Range: 30.0-36.0 g/dL   24.4  01.0  RDW Latest Range: 11.5-15.5 %   12.8  14.1  Platelets Latest Range: 150-400 K/uL   125 (L)  120 (L)   Results for Penny Delgado (MRN [id number]) as of 04/09/2011 07:28  Ref. Range 04/07/2011 22:03 04/08/2011 07:11 04/08/2011 12:26 04/08/2011 16:47 04/08/2011 21:51  Glucose-Capillary Latest Range: 70-99 mg/dL 034 (H) 87 742 (H) 99 595 (H)     X-Rays:Dg Hip Portable 1 View Right  04/03/2011  *RADIOLOGY REPORT*  Clinical Data: Postop right hip  PORTABLE RIGHT HIP - 1 VIEW  Comparison: 08/15/2010  Findings: A single portable view of the right hip is limited by underpenetration of the film.  However, this appears to demonstrate a right-sided prosthesis. The acetabular component appears properly located, as does the femoral component.  The prosthetic femoral head projects over the acetabular component.  Surgical staples are noted overlying the right buttock region.  The bony pelvis is poorly visualized.  IMPRESSION:  1.  Expected postoperative appearance of right total hip arthroplasty, as above, without immediate complicating features.  Original Report Authenticated By: Florencia Reasons, M.D.    Hospital Course: Patient was  admitted to [facility] and taken to the OR and underwent the above state procedure without complications.  Patient tolerated the procedure well and was later transferred to the recovery room and then to the orthopaedic floor for postoperative care.  They were given PO and IV analgesics for pain control following their surgery.  They were given 24 hours of postoperative antibiotics and started on DVT prophylaxis.   PT and OT were ordered for total joint protocol.  Discharge planning consulted to help with postop disposition and equipment needs.  Patient had a roguh night on the evening of surgery and was unable to get up with therapy on day one due to hypotension.  Hemovac drain was pulled without difficulty.  Continued to be unable to participate with therapy into day two due to need for blood transfusion.  Dressing was changed on day two and the incision had scant blood tinged drainage, no erythema or other signs of infection. Pt switched from Cipro to Keflex when Proteus showed on urine culture.  By day three, the patient had progressed and able to begin therapy.  Incision was healing well. On day four and five patient progressed slowly in therapy. On day six, pt participated in PT before discharge to SNF.   Discharge Medications: Prior to Admission medications   Medication Sig Start Date End Date Taking? Authorizing Provider  LORazepam (ATIVAN) 0.5 MG tablet Take 0.5 mg by mouth 2 (two) times daily as needed. Anxiety    Yes Historical Provider, MD  olmesartan (BENICAR) 40 MG tablet Take 40 mg by mouth daily after breakfast.   Yes Historical Provider, MD  cephALEXin (KEFLEX) 500 MG capsule Take 1 capsule (500 mg total) by mouth every 6 (six) hours. 04/09/11 04/19/11  Charlese Gruetzmacher Tamala Ser, PA  enoxaparin (LOVENOX) 40 MG/0.4ML SOLN Inject 0.4 mLs (40 mg total) into the skin daily. 04/09/11   Samyia Motter Tamala Ser, PA  HYDROcodone-acetaminophen (NORCO) 5-325 MG per tablet Take 1-2 tablets by mouth  every 4 (four) hours. 04/09/11 04/19/11  Silvestre Mines Tamala Ser, PA  methocarbamol (ROBAXIN) 500 MG tablet Take 1 tablet (500 mg total) by mouth every 6 (six) hours as needed. 04/09/11 04/19/11  Teka Chanda Tamala Ser, PA    Diet: carb modified - medium  Activity:weight bearing as taught is PT No bending hip over 90 degrees- A "L" Angle Do not cross legs Do not let foot roll inward  When turning these patients a pillow should be placed between the patient's legs to prevent crossing.  When ambulating and turning toward the affected side the affected leg should have the toes turned out prior to moving the walker and the rest of patient's body as to prevent internal rotation/ turning in of the leg.  Abduction pillows are the most effective way to prevent a patient from not crossing legs or turning toes in at rest. If  an abduction pillow is not ordered placing a regular pillow length wise between the patient's legs is also an effective reminder.  It is imperative that these precautions be maintained so that the surgical hip does not dislocate.    Follow-up:in 2 weeks from surgical day  Disposition: Improved  Discharged Condition: fair   Discharge Orders    Future Orders Please Complete By Expires   Diet Carb Modified      Call MD / Call 911      Comments:   If you experience chest pain or shortness of breath, CALL 911 and be transported to the hospital emergency room.  If you develope a fever above 101 F, pus (white drainage) or increased drainage or redness at the wound, or calf pain, call your surgeon's office.   Constipation Prevention      Comments:   Drink plenty of fluids.  Prune juice may be helpful.  You may use a stool softener, such as Colace (over the counter) 100 mg twice a day.  Use MiraLax (over the counter) for constipation as needed.   Increase activity slowly as tolerated      Weight Bearing as taught in Physical Therapy      Comments:   Use a walker or crutches as  instructed.   Discharge instructions      Comments:   Walk with your walker. Weight bearing as instructed. Home Health Agency will follow you at home for your therapy Change your dressing daily. Shower only, no tub bath. Call if any temperatures greater than 101 or any wound complications: [phone] during the day and ask for Dr. Jeannetta Ellis nurse, [facility] [place].   Driving restrictions      Comments:   No driving   Lifting restrictions      Comments:   No lifting   Change dressing      Comments:   You may change your dressing daily with sterile 4 x 4 inch gauze dressing and paper tape.  You may clean the incision with alcohol prior to redressing   Follow the hip precautions as taught in Physical Therapy        Medication List  As of 04/09/2011  7:18 AM   STOP taking these medications         aspirin 81 MG chewable tablet      senna 8.6 MG tablet         TAKE these medications         cephALEXin 500 MG capsule   Commonly known as: KEFLEX   Take 1 capsule (500 mg total) by mouth every 6 (six) hours.      enoxaparin 40 MG/0.4ML Soln   Commonly known as: LOVENOX   Inject 0.4 mLs (40 mg total) into the skin daily.      HYDROcodone-acetaminophen 5-325 MG per tablet   Commonly known as: NORCO   Take 1-2 tablets by mouth every 4 (four) hours.      LORazepam 0.5 MG tablet   Commonly known as: ATIVAN   Take 0.5 mg by mouth 2 (two) times daily as needed. Anxiety        methocarbamol 500 MG tablet   Commonly known as: ROBAXIN   Take 1 tablet (500 mg total) by mouth every 6 (six) hours as needed.      olmesartan 40 MG tablet   Commonly known as: BENICAR   Take 40 mg by mouth daily after breakfast.  Pt should resume aspirin 325mg  daily once Lovenox finished in one week.    SignedCeledonio Savage, Jacarius Handel [name] 04/09/2011, 7:18 AM

## 2011-04-09 NOTE — Progress Notes (Signed)
Per Dr. Darrelyn Hillock pt is to be discharged to SNF with foley cath in place due to limited mobility. Cath secured, peri/cath care performed and bag emptied prior to discharge.

## 2011-04-09 NOTE — Progress Notes (Signed)
CSW will assist with D/C planning back to [facility] today via [facility] transport.

## 2011-04-26 ENCOUNTER — Encounter (HOSPITAL_COMMUNITY): Payer: Self-pay | Admitting: Orthopedic Surgery

## 2013-01-21 ENCOUNTER — Encounter: Payer: Self-pay | Admitting: *Deleted

## 2013-03-13 ENCOUNTER — Encounter: Payer: Medicare Other | Admitting: Obstetrics & Gynecology

## 2013-03-19 ENCOUNTER — Other Ambulatory Visit: Payer: Medicare Other | Admitting: *Deleted

## 2013-03-19 ENCOUNTER — Encounter: Payer: Self-pay | Admitting: Gynecologic Oncology

## 2013-03-19 ENCOUNTER — Other Ambulatory Visit: Payer: Self-pay | Admitting: Pharmacist

## 2013-03-19 ENCOUNTER — Ambulatory Visit: Payer: Medicare Other | Attending: Gynecologic Oncology | Admitting: Gynecologic Oncology

## 2013-03-19 VITALS — BP 113/59 | HR 98 | Temp 98.4°F | Resp 20 | Ht 65.0 in | Wt 218.0 lb

## 2013-03-19 DIAGNOSIS — R110 Nausea: Secondary | ICD-10-CM

## 2013-03-19 DIAGNOSIS — R532 Functional quadriplegia: Secondary | ICD-10-CM | POA: Insufficient documentation

## 2013-03-19 DIAGNOSIS — Z96649 Presence of unspecified artificial hip joint: Secondary | ICD-10-CM | POA: Insufficient documentation

## 2013-03-19 DIAGNOSIS — Z79899 Other long term (current) drug therapy: Secondary | ICD-10-CM | POA: Insufficient documentation

## 2013-03-19 DIAGNOSIS — E119 Type 2 diabetes mellitus without complications: Secondary | ICD-10-CM | POA: Insufficient documentation

## 2013-03-19 DIAGNOSIS — R609 Edema, unspecified: Secondary | ICD-10-CM | POA: Insufficient documentation

## 2013-03-19 DIAGNOSIS — N9089 Other specified noninflammatory disorders of vulva and perineum: Secondary | ICD-10-CM | POA: Insufficient documentation

## 2013-03-19 DIAGNOSIS — R32 Unspecified urinary incontinence: Secondary | ICD-10-CM | POA: Insufficient documentation

## 2013-03-19 DIAGNOSIS — IMO0002 Reserved for concepts with insufficient information to code with codable children: Secondary | ICD-10-CM | POA: Insufficient documentation

## 2013-03-19 DIAGNOSIS — R062 Wheezing: Secondary | ICD-10-CM | POA: Insufficient documentation

## 2013-03-19 DIAGNOSIS — M1610 Unilateral primary osteoarthritis, unspecified hip: Secondary | ICD-10-CM | POA: Insufficient documentation

## 2013-03-19 DIAGNOSIS — C511 Malignant neoplasm of labium minus: Secondary | ICD-10-CM | POA: Insufficient documentation

## 2013-03-19 DIAGNOSIS — M6281 Muscle weakness (generalized): Secondary | ICD-10-CM | POA: Insufficient documentation

## 2013-03-19 DIAGNOSIS — I10 Essential (primary) hypertension: Secondary | ICD-10-CM | POA: Insufficient documentation

## 2013-03-19 MED ORDER — ONDANSETRON 8 MG PO TBDP
8.0000 mg | ORAL_TABLET | Freq: Once | ORAL | Status: AC
  Administered 2013-03-19: 8 mg via ORAL
  Filled 2013-03-19: qty 1

## 2013-03-19 MED ORDER — ONDANSETRON HCL 8 MG PO TABS: 8.0000 mg | ORAL_TABLET | Freq: Once | ORAL | Status: DC

## 2013-03-19 NOTE — Progress Notes (Signed)
Consult Note: Gyn-Onc  Consult was requested by Dr. Kathryne Eriksson for the evaluation of Penny Delgado 79 y.o. female  CC:  Chief Complaint  Patient presents with  . vulvar mass    Assessment/Plan:  [name]  is a 79 y.o.  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   Examination of the external genitalia was limited her discomfort in abducting her legs. A vaginal and rectal examination was not possible.  No adenopathy is appreciated in the bilateral inguinal areas.  A biopsy of the right vulva lesion was collected to confirm the presumptive diagnosis.  Radiation will be for a right partial radical vulvectomy with sentinel node assessment of the inguinal nodal basin.  Given the fact that she permanently resides in a facility and is minimally mobile I believe that a limited groin dissection would be of benefit.  Her comorbid conditions and her limited ambulation make preoperative evaluation very challenging. Is my recommendation that she be admitted to [facility] at which time preoperative assessments including anesthesia preop could be performed, in addition to the necessary imaging with the plan for  a radical for radical hemi-vulvectomy and sentinel lymph node dissection the following day.  I'm awaiting input from Dr. Kathryne Eriksson regarding this plan.  In the interim we will identify operative time at [facility] and possible dates for preadmission.   HPI: [name]  is a 80 y.o.  presents with complaints of bleeding from the external genitalia and burning in the area for one month. Patient currently resides in a nursing home and family members report that in July of 2014 there were informed by the nursing staff with a mass was present on her vulva for several months. Over the last month the discomfort has increased and the patient reports throbbing sharp pain that radiates to the rear.  She presents today for assessment.  Her history is notable for the fact that she is  completely immobile. The patient had limited ambulation 6 years ago and remains almost completely bed bound for approximately 4 years.  She underwent a fall with a head injury in July of 2012 with fracture of her hip and at that point was transferred to a nursing facility. There was subsequent significant enough weight loss for the area to be consideration of a hip replacement. Hip replacement occurred in February of 3710 and was complicated by aseptic necrosis of the head of the femur. She subsequently developed failure to thrive.    Past Medical History  Diagnosis Date  . Rhabdomyolysis 08-15-2010  . Acute renal failure 08-15-2010 secondary to rhabdomylosis  . Hypertension   . Diabetes mellitus   . Asthma   . Hypercholesteremia   . Thrombocytopenia   . Primary osteoarthritis of right hip   . DEMENTIA   . Aseptic necrosis of femur   . Failure to thrive   . Abnormal gait   . CHF (congestive heart failure)   . Migraine   . UTI (urinary tract infection)     Social Hx:   History   Social History  . Marital Status: Married    Spouse Name: N/A    Number of Children: N/A  . Years of Education: N/A   Occupational History  . Not on file.   Social History Main Topics  . Smoking status: Never Smoker   . Smokeless tobacco: Never Used  . Alcohol Use: No  . Drug Use: No  . Sexual Activity:  Other Topics Concern  . Not on file   Social History Narrative  . No narrative on file  Resides in a nursing facility and is immobile.  Past Surgical Hx:  Past Surgical History  Procedure Laterality Date  . Bilateral knee arthroplasty  one done march 2004    2004  . Back surgery   15 yrs ago    not sure upper or lower back  . Cholecystectomy  yrs ago  . Abdominal hysterectomy  yrs ago  . Total hip arthroplasty  04/03/2011    Procedure: TOTAL HIP ARTHROPLASTY;  Surgeon: Tobi Bastos, MD;  Location: [facility]S;  Service: Orthopedics;  Laterality: Right;    Current outpatient  prescriptions:Cranberry 475 MG CAPS, Take by mouth daily. , Disp: , Rfl: ;  olmesartan (BENICAR) 40 MG tablet, Take 40 mg by mouth daily after breakfast., Disp: , Rfl: ;  acetaminophen (TYLENOL) 500 MG tablet, Take 500 mg by mouth every 6 (six) hours as needed., Disp: , Rfl: ;  enoxaparin (LOVENOX) 40 MG/0.4ML SOLN, Inject 0.4 mLs (40 mg total) into the skin daily., Disp: 7 Syringe, Rfl: 0 LORazepam (ATIVAN) 0.5 MG tablet, Take 0.5 mg by mouth 2 (two) times daily as needed. Anxiety , Disp: , Rfl: ;  senna (SENOKOT) 8.6 MG tablet, Take 1 tablet by mouth as needed for constipation., Disp: , Rfl: ;  traMADol (ULTRAM) 50 MG tablet, Take 50 mg by mouth every 6 (six) hours as needed., Disp: , Rfl:  No current facility-administered medications for this visit. Facility-Administered Medications Ordered in Other Visits: ondansetron (ZOFRAN) tablet 8 mg, 8 mg, Oral, Once, Dorothyann Gibbs, NP  Past Gynecological History:  Gravida 1 para 1 No LMP recorded. Patient has had a hysterectomy.  Family Hx: No family history on file.  Review of Systems:  Constitutional  Cardiovascular  No chest pain, shortness of breath, or edema  Pulmonary  No cough or wheeze.  Gastro Intestinal  No nausea, vomitting, or diarrhoea. No bright red blood per rectum, no abdominal pain, fecal incontinence Genito Urinary  Incontinent, reports constant burning pain of the external genitalia. Musculo Skeletal  Muscle weakness Neurologic  Has been immobile for greater than 6 years. Sports horrible nightmares  Vitals:  Blood pressure 113/59, pulse 98, temperature 98.4 F (36.9 C), temperature source Oral, resp. rate 20, height 5\' 5"  (1.651 m), weight 218 lb (98.884 kg).  Physical Exam: WD in uncomfortable lying on a gurney, limited mobility Neck  Supple NROM, without any enlargements.  Lymph Node Survey No cervical supraclavicular or inguinal adenopathy Cardiovascular  Pulse normal rate, regularity and rhythm.  Lungs  Clear  to auscultation bilateraly, wheezes bilaterally.  Poor breath sounds at the bases.  Psychiatry  Alert and thinks that it's 1919 and that Benjamin Stain is the president.  Is able to subtract three from 20 and 17 Abdomen  Normoactive bowel sounds, abdomen soft, non-tender and obese.  Back No CVA tenderness Genito Urinary  Vulva/vagina: Examination is limited by the patient's inability to adduct her legs and the fact that the examination occurred on the medical gurney .  Edematous and pendulous labia minora bilaterally.  Right superior labia minora with 2.5 cm raised mass.  The area was prepped with betadine and a biopsy collected.  Hemostasis was achieved with pressure and monsels.  Superficial excoriation of the contralateral labia minora noted. Rectal  Unable to perform Extremities  2-3+ lower extremity edema. Large scars over both knees limited ability to adduct either lower extremity  Janie Morning, MD, PhD 03/19/2013, 3:44 PM

## 2013-03-19 NOTE — Patient Instructions (Signed)
We will arrange for future surgery if cleared by your medical provider.

## 2013-03-27 ENCOUNTER — Telehealth: Payer: Self-pay | Admitting: *Deleted

## 2013-03-27 NOTE — Telephone Encounter (Signed)
Call to pt's son Angie Piercey discussed  Pt will have surgery on 2/25 at [facility] as it was a vulvar mass, lymph nodes will be dissected to check for metastasis. He should expect a call from [place] at [facility] who coordinates pre-op appts prior to surgery. Pt may need to arrive a day or 2 prior. Doug verbalized understanding, no concerns at this time.

## 2013-04-27 ENCOUNTER — Telehealth: Payer: Self-pay | Admitting: *Deleted

## 2013-04-27 NOTE — Telephone Encounter (Signed)
Per Dr. Skeet Latch, Penny Delgado needs a follow up appointment on March 19. Penny Delgado was scheduled to see Dr. Skeet Latch March 19,2015 @ 3:00. I spoke with Noel Gerold, Mrs Cecere son and advised him of appointment date and time.

## 2013-05-06 ENCOUNTER — Telehealth: Payer: Self-pay | Admitting: *Deleted

## 2013-05-06 NOTE — Telephone Encounter (Signed)
Called Mr. Mosco to give information of Penny Delgado  Future appt on May 14, 2013 @ 3:00., [facility] was contacted as well and notified of the future appointment.

## 2013-05-13 ENCOUNTER — Telehealth: Payer: Self-pay | Admitting: Gynecologic Oncology

## 2013-05-13 NOTE — Telephone Encounter (Signed)
Gyn Oncology Office Note  Chief Complaint:  Vulvar cancer   Assessment/Plan : Penny Delgado is a 79 y.o. 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  Close outpatient follow up.  History of Present Illness: Penny Delgado is a  79 y.o. who presented with pain and a vulvar lesion. Biopsy performed and was consistent with a squamous cell carcinoma of the vulva. Exam was limited secondary to patient's mobility and osteonecrosis of her hip but a ~3cm lesion was noted.  Procedures: Radical partial vulvectomy with sentinel lymph node biopsy 04/24/2013  Findings: 3cm right vulvar lesion consistent with known malignancy, no palpable groin adenopathy, indurated bilateral labia majora secondary to chronic urinary incontinence, single blue/avid right inguinal lymph node   Pathology: A: Sentinel lymph node #1, right inguinal, biopsy - Two lymph nodes, negative for metastatic carcinoma on H&E and pancytokeratin stains (0/2) B: Vulva, left labia, partial vulvectomy - Benign squamous mucosa with abundant mixed inflammation and significant reactive changes - No dysplasia or malignancy identified (see comment) C: Vulva, right, partial vulvectomy Histologic type: Invasive squamous cell carcinoma Histologic grade: Well differentiated Tumor focality/site: Unifocal; 9 o'clock half of specimen  Tumor size: 3.1 cm in largest dimension (gross measurement) Depth of invasion: 4 mm Lymphovascular space invasion: Focal area suspicious for LVSI (pending immunostains, results will be issued in an addendum)  Comment: P16 immunostains are performed on part B and are negative for diffuse block positivity staining within the squamous epithelium, consistent with the diagnosis of reactive changes with no high grade dysplasia identified. Margins of resection: Invasive: Peripheral: Negative; 3 mm to black-inked peripheral margin  Deep: Negative; 8 mm to deep  margin Intraepithelial: Peripheral: Negative  Deep: Negative  Involvement of adjacent organs: Not applicable  Regional lymph nodes (see specimen A):  Total number involved: 0  Total number examined: 2   Additional pathologic findings:  - Focal ulceration, mixed inflammation, and reactive epithelial changes  AJCC Pathologic Stage: pT1b pN0 pMX   Past Medical History:  Past Medical History   Past Medical History   Diagnosis  Date   .  Obesity    .  Vulvar cancer  2015   .  Hypertension    .  Diabetes mellitus    .  Osteoarthritis    .  CHF (congestive heart failure)    .  Migraine    .  Dementia      mild       Past Surgical History:  Past Surgical History   Past Surgical History   Procedure  Laterality  Date   .  Appendectomy     .  Cholecystectomy     .  Hysterectomy     .  Tonsilectomy, adenoidectomy, bilateral myringotomy and tubes     .  Total hip arthroplasty     .  Knee arthroscopy     .  Pr biopsy/excision, lymph node(s)  N/A  04/24/2013     Procedure: BX/EXC LYMPH NODE; OPEN, SUPERF (SEPART PROC); Surgeon: Alferd Patee, MD; Location: MAIN OR [facility]; Service: Surgical Oncology   .  Pr intraoperative sentinel lymph node id w dye injection  N/A  04/24/2013     Procedure: INTRAOPERATIVE IDENTIFICATION SENTINEL LYMPH NODE(S) INCLUDE INJECTION NON-RADIOACTIVE DYE, WHEN PERFORMED; Surgeon: Alferd Patee, MD; Location: [facility] OR [facility]; Service: Surgical Oncology   .  Pr vulva resect,radical,partial  Right  04/24/2013     Procedure: VULVECTOMY, RADICAL, PARTIAL; Surgeon: Devra Dopp, MD;  Location: MAIN OR [facility]; Service: Gynecology Oncology       Family History:  Family History   No family history on file.  ROS:      Physical Exam: Constitutional: She appears well-developed and well-nourished.  HENT:  Head: Normocephalic and atraumatic.  Cardiovascular: Normal rate, regular rhythm and normal heart sounds.  Pulmonary/Chest: Effort normal and breath sounds  normal.  Abdominal: Soft. Bowel sounds are normal. She exhibits no distension. There is no tenderness.  Genitourinary:  Neurological: She is alert. No cranial nerve deficit.  Psychiatric:  Patient is tangential, unable to answer most questions regarding her medications, medical history. She frequently talks of and to a person not in the room.

## 2013-05-14 ENCOUNTER — Encounter: Payer: Self-pay | Admitting: Gynecologic Oncology

## 2013-05-14 ENCOUNTER — Ambulatory Visit: Payer: Medicare Other | Attending: Gynecologic Oncology | Admitting: Gynecologic Oncology

## 2013-05-14 VITALS — BP 126/61 | HR 76 | Resp 20

## 2013-05-14 DIAGNOSIS — F0390 Unspecified dementia without behavioral disturbance: Secondary | ICD-10-CM | POA: Insufficient documentation

## 2013-05-14 DIAGNOSIS — C519 Malignant neoplasm of vulva, unspecified: Secondary | ICD-10-CM | POA: Insufficient documentation

## 2013-05-14 DIAGNOSIS — M87059 Idiopathic aseptic necrosis of unspecified femur: Secondary | ICD-10-CM | POA: Insufficient documentation

## 2013-05-14 DIAGNOSIS — Z90710 Acquired absence of both cervix and uterus: Secondary | ICD-10-CM | POA: Insufficient documentation

## 2013-05-14 DIAGNOSIS — I10 Essential (primary) hypertension: Secondary | ICD-10-CM | POA: Insufficient documentation

## 2013-05-14 DIAGNOSIS — Z9079 Acquired absence of other genital organ(s): Secondary | ICD-10-CM | POA: Insufficient documentation

## 2013-05-14 DIAGNOSIS — E119 Type 2 diabetes mellitus without complications: Secondary | ICD-10-CM | POA: Insufficient documentation

## 2013-05-14 DIAGNOSIS — I509 Heart failure, unspecified: Secondary | ICD-10-CM | POA: Insufficient documentation

## 2013-05-14 DIAGNOSIS — Z9089 Acquired absence of other organs: Secondary | ICD-10-CM | POA: Insufficient documentation

## 2013-05-14 NOTE — Progress Notes (Signed)
Gyn Oncology Office Note  Chief Complaint:  Vulvar cancer   Assessment/Plan : Penny Delgado is a 79 y.o. 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 No adjuvant therapy indicated Continue the excellent care of the incision sites Close outpatient follow up. Follow-up in 6 months  History of Present Illness: Penny Delgado is a  79 y.o. who presented with pain and a vulvar lesion. Biopsy performed and was consistent with a squamous cell carcinoma of the vulva. Exam was limited secondary to patient's mobility and osteonecrosis of her hip but a ~3cm lesion was noted.  Procedures: Radical partial vulvectomy with sentinel lymph node biopsy 04/24/2013 at [facility] Pathology: A: Sentinel lymph node #1, right inguinal, biopsy - Two lymph nodes, negative for metastatic carcinoma on H&E and pancytokeratin stains (0/2) B: Vulva, left labia, partial vulvectomy - Benign squamous mucosa with abundant mixed inflammation and significant reactive changes - No dysplasia or malignancy identified (see comment) C: Vulva, right, partial vulvectomy Histologic type: Invasive squamous cell carcinoma Histologic grade: Well differentiated Tumor focality/site: Unifocal; 9 o'clock half of specimen  Tumor size: 3.1 cm in largest dimension (gross measurement) Depth of invasion: 4 mm Lymphovascular space invasion: Focal area suspicious for LVSI (pending immunostains, results will be issued in an addendum)  Comment: P16 immunostains are performed on part B and are negative for diffuse block positivity staining within the squamous epithelium, consistent with the diagnosis of reactive changes with no high grade dysplasia identified. Margins of resection: Invasive: Peripheral: Negative; 3 mm to black-inked peripheral margin  Deep: Negative; 8 mm to deep margin Intraepithelial: Peripheral: Negative  Deep: Negative Involvement of adjacent organs: Not applicable  Regional lymph nodes (see  specimen A):  Total number involved: 0  Total number examined: 2   Additional pathologic findings:  - Focal ulceration, mixed inflammation, and reactive epithelial changes  AJCC Pathologic Stage: pT1b pN0 pMX   Past Medical History:  Past Medical History   Past Medical History   Diagnosis  Date   .  Obesity    .  Vulvar cancer  2015   .  Hypertension    .  Diabetes mellitus    .  Osteoarthritis    .  CHF (congestive heart failure)    .  Migraine    .  Dementia      mild       Past Surgical History:  Past Surgical History   Past Surgical History   Procedure  Laterality  Date   .  Appendectomy     .  Cholecystectomy     .  Hysterectomy     .  Tonsilectomy, adenoidectomy, bilateral myringotomy and tubes     .  Total hip arthroplasty     .  Knee arthroscopy     .  Pr biopsy/excision, lymph node(s)  N/A  04/24/2013     Procedure: BX/EXC LYMPH NODE; OPEN, SUPERF (SEPART PROC); Surgeon: Alferd Patee, MD; Location: MAIN OR [facility]; Service: Surgical Oncology   .  Pr intraoperative sentinel lymph node id w dye injection  N/A  04/24/2013     Procedure: INTRAOPERATIVE IDENTIFICATION SENTINEL LYMPH NODE(S) INCLUDE INJECTION NON-RADIOACTIVE DYE, WHEN PERFORMED; Surgeon: Alferd Patee, MD; Location: MAIN OR [facility]; Service: Surgical Oncology   .  Pr vulva resect,radical,partial  Right  04/24/2013     Procedure: VULVECTOMY, RADICAL, PARTIAL; Surgeon: Devra Dopp, MD; Location: MAIN OR [facility]; Service: Gynecology Oncology       ROS: Patient  is  not interactive      Physical Exam: Constitutional: She appears well-developed and well-nourished.  HENT:  Head: Normocephalic and atraumatic.  Abdominal: Soft. Bowel sounds are normal. She exhibits no distension. There is no tenderness.  Genitourinary: R inguinal drain removed.  Vuva with superficial wound separation.  Good granulation tissue  Neurological: She is alert. No cranial nerve deficit.  Psychiatric: Patient not very interactive.   Believes that this is November 2019 and that she is at [facility]

## 2013-05-14 NOTE — Patient Instructions (Addendum)
Follow-up in 6 months.  Please call in June or July to schedule an appointment with Dr. Skeet Latch.  Keep the incision clean and dry.   Thank you very much [name] for allowing me to provide care for you today.  I appreciate your confidence in choosing our Gynecologic Oncology team.  If you have any questions about your visit today please call our office and we will get back to you as soon as possible.  Francetta Found. Daeja Helderman MD., PhD Gynecologic Oncology

## 2013-08-07 ENCOUNTER — Ambulatory Visit: Payer: Medicare Other | Admitting: Gynecology

## 2013-11-25 ENCOUNTER — Ambulatory Visit: Payer: Medicare Other | Admitting: Gynecologic Oncology

## 2015-02-07 ENCOUNTER — Emergency Department (HOSPITAL_COMMUNITY): Payer: Medicare Other

## 2015-02-07 ENCOUNTER — Inpatient Hospital Stay (HOSPITAL_COMMUNITY): Payer: Medicare Other

## 2015-02-07 ENCOUNTER — Encounter (HOSPITAL_COMMUNITY): Payer: Self-pay | Admitting: Emergency Medicine

## 2015-02-07 ENCOUNTER — Inpatient Hospital Stay (HOSPITAL_COMMUNITY)
Admission: EM | Admit: 2015-02-07 | Discharge: 2015-02-10 | DRG: 871 | Disposition: A | Discharge status: Skilled Nursing Facility | Payer: Medicare Other | Attending: Internal Medicine | Admitting: Internal Medicine

## 2015-02-07 DIAGNOSIS — J45909 Unspecified asthma, uncomplicated: Secondary | ICD-10-CM | POA: Diagnosis present

## 2015-02-07 DIAGNOSIS — E861 Hypovolemia: Secondary | ICD-10-CM | POA: Diagnosis present

## 2015-02-07 DIAGNOSIS — Z96641 Presence of right artificial hip joint: Secondary | ICD-10-CM | POA: Diagnosis present

## 2015-02-07 DIAGNOSIS — R627 Adult failure to thrive: Secondary | ICD-10-CM | POA: Diagnosis present

## 2015-02-07 DIAGNOSIS — N19 Unspecified kidney failure: Secondary | ICD-10-CM | POA: Diagnosis not present

## 2015-02-07 DIAGNOSIS — D649 Anemia, unspecified: Secondary | ICD-10-CM | POA: Diagnosis present

## 2015-02-07 DIAGNOSIS — N179 Acute kidney failure, unspecified: Secondary | ICD-10-CM | POA: Diagnosis present

## 2015-02-07 DIAGNOSIS — Z7401 Bed confinement status: Secondary | ICD-10-CM | POA: Diagnosis not present

## 2015-02-07 DIAGNOSIS — R4182 Altered mental status, unspecified: Secondary | ICD-10-CM | POA: Diagnosis not present

## 2015-02-07 DIAGNOSIS — A419 Sepsis, unspecified organism: Secondary | ICD-10-CM | POA: Diagnosis not present

## 2015-02-07 DIAGNOSIS — E1122 Type 2 diabetes mellitus with diabetic chronic kidney disease: Secondary | ICD-10-CM | POA: Diagnosis present

## 2015-02-07 DIAGNOSIS — Z881 Allergy status to other antibiotic agents status: Secondary | ICD-10-CM | POA: Diagnosis not present

## 2015-02-07 DIAGNOSIS — Z66 Do not resuscitate: Secondary | ICD-10-CM | POA: Diagnosis present

## 2015-02-07 DIAGNOSIS — I130 Hypertensive heart and chronic kidney disease with heart failure and stage 1 through stage 4 chronic kidney disease, or unspecified chronic kidney disease: Secondary | ICD-10-CM | POA: Diagnosis present

## 2015-02-07 DIAGNOSIS — Z7982 Long term (current) use of aspirin: Secondary | ICD-10-CM

## 2015-02-07 DIAGNOSIS — E872 Acidosis: Secondary | ICD-10-CM | POA: Diagnosis present

## 2015-02-07 DIAGNOSIS — Z515 Encounter for palliative care: Secondary | ICD-10-CM | POA: Diagnosis present

## 2015-02-07 DIAGNOSIS — I959 Hypotension, unspecified: Secondary | ICD-10-CM | POA: Diagnosis present

## 2015-02-07 DIAGNOSIS — Z888 Allergy status to other drugs, medicaments and biological substances status: Secondary | ICD-10-CM

## 2015-02-07 DIAGNOSIS — L8990 Pressure ulcer of unspecified site, unspecified stage: Secondary | ICD-10-CM | POA: Insufficient documentation

## 2015-02-07 DIAGNOSIS — Z79899 Other long term (current) drug therapy: Secondary | ICD-10-CM

## 2015-02-07 DIAGNOSIS — M1611 Unilateral primary osteoarthritis, right hip: Secondary | ICD-10-CM | POA: Diagnosis present

## 2015-02-07 DIAGNOSIS — G894 Chronic pain syndrome: Secondary | ICD-10-CM | POA: Diagnosis present

## 2015-02-07 DIAGNOSIS — F329 Major depressive disorder, single episode, unspecified: Secondary | ICD-10-CM | POA: Diagnosis present

## 2015-02-07 DIAGNOSIS — I272 Other secondary pulmonary hypertension: Secondary | ICD-10-CM | POA: Diagnosis present

## 2015-02-07 DIAGNOSIS — G9340 Encephalopathy, unspecified: Secondary | ICD-10-CM | POA: Diagnosis not present

## 2015-02-07 DIAGNOSIS — F0393 Unspecified dementia, unspecified severity, with mood disturbance: Secondary | ICD-10-CM

## 2015-02-07 DIAGNOSIS — F0390 Unspecified dementia without behavioral disturbance: Secondary | ICD-10-CM | POA: Diagnosis present

## 2015-02-07 DIAGNOSIS — N170 Acute kidney failure with tubular necrosis: Secondary | ICD-10-CM | POA: Diagnosis not present

## 2015-02-07 DIAGNOSIS — E7800 Pure hypercholesterolemia, unspecified: Secondary | ICD-10-CM | POA: Diagnosis present

## 2015-02-07 DIAGNOSIS — F0280 Dementia in other diseases classified elsewhere without behavioral disturbance: Secondary | ICD-10-CM

## 2015-02-07 DIAGNOSIS — N183 Chronic kidney disease, stage 3 (moderate): Secondary | ICD-10-CM | POA: Diagnosis present

## 2015-02-07 DIAGNOSIS — N390 Urinary tract infection, site not specified: Secondary | ICD-10-CM | POA: Diagnosis not present

## 2015-02-07 LAB — CBC WITH DIFFERENTIAL/PLATELET
BASOS ABS: 0.1 10*3/uL (ref 0.0–0.1)
BASOS PCT: 1 %
EOS ABS: 0.3 10*3/uL (ref 0.0–0.7)
EOS PCT: 4 %
HCT: 36.3 % (ref 36.0–46.0)
Hemoglobin: 11.8 g/dL — ABNORMAL LOW (ref 12.0–15.0)
Lymphocytes Relative: 20 %
Lymphs Abs: 1.5 10*3/uL (ref 0.7–4.0)
MCH: 30.2 pg (ref 26.0–34.0)
MCHC: 32.5 g/dL (ref 30.0–36.0)
MCV: 92.8 fL (ref 78.0–100.0)
MONO ABS: 0.2 10*3/uL (ref 0.1–1.0)
Monocytes Relative: 3 %
Neutro Abs: 5.5 10*3/uL (ref 1.7–7.7)
Neutrophils Relative %: 72 %
PLATELETS: 215 10*3/uL (ref 150–400)
RBC: 3.91 MIL/uL (ref 3.87–5.11)
RDW: 14.4 % (ref 11.5–15.5)
WBC: 7.6 10*3/uL (ref 4.0–10.5)

## 2015-02-07 LAB — COMPREHENSIVE METABOLIC PANEL
ALBUMIN: 3.3 g/dL — AB (ref 3.5–5.0)
ALK PHOS: 79 U/L (ref 38–126)
ALT: 12 U/L — ABNORMAL LOW (ref 14–54)
AST: 14 U/L — AB (ref 15–41)
Anion gap: 17 — ABNORMAL HIGH (ref 5–15)
BILIRUBIN TOTAL: 0.6 mg/dL (ref 0.3–1.2)
BUN: 132 mg/dL — AB (ref 6–20)
CALCIUM: 9 mg/dL (ref 8.9–10.3)
CO2: 17 mmol/L — ABNORMAL LOW (ref 22–32)
CREATININE: 6.06 mg/dL — AB (ref 0.44–1.00)
Chloride: 104 mmol/L (ref 101–111)
GFR calc Af Amer: 7 mL/min — ABNORMAL LOW (ref >60)
GFR, EST NON AFRICAN AMERICAN: 6 mL/min — AB (ref >60)
GLUCOSE: 96 mg/dL (ref 65–99)
Potassium: 5.1 mmol/L (ref 3.5–5.1)
Sodium: 138 mmol/L (ref 135–145)
TOTAL PROTEIN: 8.2 g/dL — AB (ref 6.5–8.1)

## 2015-02-07 LAB — URINE MICROSCOPIC-ADD ON

## 2015-02-07 LAB — I-STAT TROPONIN, ED: TROPONIN I, POC: 0.01 ng/mL (ref 0.00–0.08)

## 2015-02-07 LAB — URINALYSIS, ROUTINE W REFLEX MICROSCOPIC
BILIRUBIN URINE: NEGATIVE
Glucose, UA: NEGATIVE mg/dL
KETONES UR: NEGATIVE mg/dL
Nitrite: NEGATIVE
PROTEIN: 100 mg/dL — AB
Specific Gravity, Urine: 1.015 (ref 1.005–1.030)
pH: 6 (ref 5.0–8.0)

## 2015-02-07 LAB — MRSA PCR SCREENING: MRSA by PCR: NEGATIVE

## 2015-02-07 LAB — I-STAT CG4 LACTIC ACID, ED
Lactic Acid, Venous: 0.75 mmol/L (ref 0.5–2.0)
Lactic Acid, Venous: 1.13 mmol/L (ref 0.5–2.0)

## 2015-02-07 MED ORDER — ONDANSETRON HCL 4 MG PO TABS: 4.0000 mg | ORAL_TABLET | Freq: Four times a day (QID) | ORAL | Status: DC | PRN

## 2015-02-07 MED ORDER — ONDANSETRON HCL 4 MG/2ML IJ SOLN: 4.0000 mg | Freq: Four times a day (QID) | INTRAMUSCULAR | Status: DC | PRN

## 2015-02-07 MED ORDER — SODIUM CHLORIDE 0.9 % IV BOLUS (SEPSIS)
30.0000 mL/kg | Freq: Once | INTRAVENOUS | Status: AC
  Administered 2015-02-07: 2721 mL via INTRAVENOUS

## 2015-02-07 MED ORDER — SODIUM CHLORIDE 0.9 % IV SOLN
INTRAVENOUS | Status: DC
  Administered 2015-02-07 – 2015-02-08 (×2): via INTRAVENOUS

## 2015-02-07 MED ORDER — SODIUM CHLORIDE 0.9 % IV BOLUS (SEPSIS)
1000.0000 mL | Freq: Once | INTRAVENOUS | Status: AC
  Administered 2015-02-07: 1000 mL via INTRAVENOUS

## 2015-02-07 MED ORDER — HEPARIN SODIUM (PORCINE) 5000 UNIT/ML IJ SOLN
5000.0000 [IU] | Freq: Three times a day (TID) | INTRAMUSCULAR | Status: DC
  Administered 2015-02-07 – 2015-02-08 (×2): 5000 [IU] via SUBCUTANEOUS
  Filled 2015-02-07 (×2): qty 1

## 2015-02-07 MED ORDER — ACETAMINOPHEN 325 MG PO TABS
650.0000 mg | ORAL_TABLET | Freq: Four times a day (QID) | ORAL | Status: DC | PRN
Start: 2015-02-07 — End: 2015-02-10

## 2015-02-07 MED ORDER — ASPIRIN 325 MG PO TABS
325.0000 mg | ORAL_TABLET | Freq: Every day | ORAL | Status: DC
  Administered 2015-02-08 – 2015-02-09 (×2): 325 mg via ORAL
  Filled 2015-02-07 (×2): qty 1

## 2015-02-07 MED ORDER — PIPERACILLIN-TAZOBACTAM IN DEX 2-0.25 GM/50ML IV SOLN
2.2500 g | Freq: Three times a day (TID) | INTRAVENOUS | Status: DC
  Administered 2015-02-07 – 2015-02-10 (×8): 2.25 g via INTRAVENOUS
  Filled 2015-02-07 (×9): qty 50

## 2015-02-07 MED ORDER — PIPERACILLIN-TAZOBACTAM 3.375 G IVPB 30 MIN
3.3750 g | Freq: Once | INTRAVENOUS | Status: AC
  Administered 2015-02-07: 3.375 g via INTRAVENOUS
  Filled 2015-02-07: qty 50

## 2015-02-07 MED ORDER — ACETAMINOPHEN 650 MG RE SUPP: 650.0000 mg | Freq: Four times a day (QID) | RECTAL | Status: DC | PRN

## 2015-02-07 MED ORDER — ALBUTEROL SULFATE (2.5 MG/3ML) 0.083% IN NEBU: 3.0000 mL | INHALATION_SOLUTION | Freq: Four times a day (QID) | RESPIRATORY_TRACT | Status: DC | PRN

## 2015-02-07 MED ORDER — SODIUM CHLORIDE 0.9 % IV SOLN
2000.0000 mg | Freq: Once | INTRAVENOUS | Status: AC
  Administered 2015-02-07: 2000 mg via INTRAVENOUS
  Filled 2015-02-07: qty 2000

## 2015-02-07 NOTE — ED Notes (Signed)
Hospitalist changed bed request

## 2015-02-07 NOTE — Consult Note (Addendum)
Renal Service Consult Note [facility]  Penny Delgado 02/07/2015 Roney Jaffe D Requesting Physician: Dr Tyrone Nine   Reason for Consult:  Acute renal failure HPI: The patient is a 80 y.o. year-old with hx of HTN, DM, dementia w baseline creat of 1.52 presenting [facility] the ED from the SNF with decreased PO intake and refusing medications.  Found to have severe azotemia.  BP's 80's , BUN 132 and Creat 6.06.  CXR severe DJD shoulders, no acute disease. WBC 7k, temp 97.6.    Patient is confused and disoriented ,does not provide any reliable history.    Past Medical History  Past Medical History  Diagnosis Date  . Rhabdomyolysis 08-15-2010  . Acute renal failure ([place]) 08-15-2010 secondary to rhabdomylosis  . Hypertension   . Diabetes mellitus   . Asthma   . Hypercholesteremia   . Thrombocytopenia ([place])   . Primary osteoarthritis of right hip   . DEMENTIA   . Aseptic necrosis of femur ([place])   . Failure to thrive   . Abnormal gait   . CHF (congestive heart failure) ([place])   . Migraine   . UTI (urinary tract infection)    Past Surgical History  Past Surgical History  Procedure Laterality Date  . Bilateral knee arthroplasty  one done march 2004    2004  . Back surgery   15 yrs ago    not sure upper or lower back  . Cholecystectomy  yrs ago  . Abdominal hysterectomy  yrs ago  . Total hip arthroplasty  04/03/2011    Procedure: TOTAL HIP ARTHROPLASTY;  Surgeon: Tobi Bastos, MD;  Location: [facility];  Service: Orthopedics;  Laterality: Right;  Eugenie Norrie      at [place] History reviewed. No pertinent family history. Social History  reports that she has never smoked. She has never used smokeless tobacco. She reports that she does not drink alcohol or use illicit drugs. Allergies  Allergies  Allergen Reactions  . Mirtazapine     Per MAR  . Nitrofurantoin     unknown   Home medications Prior to Admission medications   Medication Sig Start Date End  Date Taking? Authorizing Provider  acetaminophen (TYLENOL) 500 MG tablet Take 500 mg by mouth every 6 (six) hours as needed for moderate pain.    Yes Historical Provider, MD  albuterol (PROVENTIL) (2.5 MG/3ML) 0.083% nebulizer solution Take 3 mLs by nebulization 4 (four) times daily as needed. Shortness of breathe. 11/02/14  Yes Historical Provider, MD  aspirin 325 MG tablet Take 325 mg by mouth daily.   Yes Historical Provider, MD  Cranberry 400 MG TABS Take 400 mg by mouth daily.   Yes Historical Provider, MD  docusate sodium (STOOL SOFTENER) 100 MG capsule Take 100 mg by mouth 2 (two) times daily. 04/25/13  Yes Historical Provider, MD  meclizine (ANTIVERT) 12.5 MG tablet Take 12.5 mg by mouth every 4 (four) hours as needed for dizziness or nausea.   Yes Historical Provider, MD  Melatonin 3 MG CAPS Take 6 mg by mouth at bedtime.   Yes Historical Provider, MD  olmesartan (BENICAR) 40 MG tablet Take 40 mg by mouth daily after breakfast.   Yes Historical Provider, MD  PREMARIN vaginal cream Place 1 Applicatorful vaginally 2 (two) times a week. Wed and Saturday 12/30/14  Yes Historical Provider, MD  sennosides-docusate sodium (SENOKOT-S) 8.6-50 MG tablet Take 1 tablet by mouth daily as needed for constipation.   Yes Historical Provider, MD  sertraline (ZOLOFT) 50 MG tablet Take 50 mg by mouth daily.   Yes Historical Provider, MD  traMADol (ULTRAM) 50 MG tablet Take 50 mg by mouth every 6 (six) hours as needed for moderate pain.    Yes Historical Provider, MD  Cranberry 475 MG CAPS Take by mouth daily.     Historical Provider, MD  senna (SENOKOT) 8.6 MG tablet Take 1 tablet by mouth as needed for constipation.    Historical Provider, MD   Liver Function Tests  Recent Labs Lab 02/07/15 1348  AST 14*  ALT 12*  ALKPHOS 79  BILITOT 0.6  PROT 8.2*  ALBUMIN 3.3*   No results for input(s): LIPASE, AMYLASE in the last 168 hours. CBC  Recent Labs Lab 02/07/15 1348  WBC 7.6  NEUTROABS 5.5  HGB  11.8*  HCT 36.3  MCV 92.8  PLT 123456   Basic Metabolic Panel  Recent Labs Lab 02/07/15 1348  NA 138  K 5.1  CL 104  CO2 17*  GLUCOSE 96  BUN 132*  CREATININE 6.06*  CALCIUM 9.0    Filed Vitals:   02/07/15 1357 02/07/15 1443 02/07/15 1500 02/07/15 1530  BP:  97/58 92/51 96/61   Pulse:  104 96 94  Temp:      TempSrc:      Resp:  14 16 21   Height: 5\' 6"  (1.676 m)     Weight: 90.719 kg (200 lb)     SpO2:  99% 98% 100%   Exam Awake, chron ill appearing No rash, cyanosis or gangrene Sclera anicteric, throat clear No jvd Chest CTA bilat RRR no MRG Abd obese nontender no ascites or masses GU deferred Ext obese, no sig edema or LE or UE's Neuro "1915", "hospital"  UA turbid, tntc wbc's, 0-5 rbc  Assessment: 1 Acute on CKD3 - prob ATN vs functional AKI due to hypotension/ vol depletion/ARB, with or w/o sepsis 2 Pyuria prob UTI 3 Dementia 4 CKD 3 baseline creat 1.2- 1.6 5 HTN on benicar 6 Depression   Plan - recommend IVF"s and supportive care.  Not a dialysis candidate due to comorbidities. Will sign off, call as needed.   Kelly Splinter MD [employer] pager [phone]    cell [phone] 02/07/2015, 4:17 PM

## 2015-02-07 NOTE — ED Provider Notes (Signed)
CSN: [id number]     Arrival date & time 02/07/15  1319 History   First MD Initiated Contact with Patient 02/07/15 1332     Chief Complaint  Patient presents with  . Failure To Thrive     (Consider location/radiation/quality/duration/timing/severity/associated sxs/prior Treatment) Patient is a 80 y.o. female presenting with general illness. The history is provided by the patient.  Illness Severity:  Severe Onset quality:  Sudden Duration:  2 days Timing:  Constant Progression:  Worsening Chronicity:  New Associated symptoms: fatigue   Associated symptoms: no chest pain, no congestion, no fever, no headaches, no myalgias, no nausea, no rhinorrhea, no shortness of breath, no vomiting and no wheezing     80 yo F With a chief complaint of failure to thrive. Patient has been refusing medications not drinking and eating less for the past couple days. Level 5 caveat altered mental status. No other specific history given by the nursing home or the EMS personnel. Patient is unable to provide much further history.  Past Medical History  Diagnosis Date  . Rhabdomyolysis 08-15-2010  . Acute renal failure ([place]) 08-15-2010 secondary to rhabdomylosis  . Hypertension   . Diabetes mellitus   . Asthma   . Hypercholesteremia   . Thrombocytopenia ([place])   . Primary osteoarthritis of right hip   . DEMENTIA   . Aseptic necrosis of femur ([place])   . Failure to thrive   . Abnormal gait   . CHF (congestive heart failure) ([place])   . Migraine   . UTI (urinary tract infection)    Past Surgical History  Procedure Laterality Date  . Bilateral knee arthroplasty  one done march 2004    2004  . Back surgery   15 yrs ago    not sure upper or lower back  . Cholecystectomy  yrs ago  . Abdominal hysterectomy  yrs ago  . Total hip arthroplasty  04/03/2011    Procedure: TOTAL HIP ARTHROPLASTY;  Surgeon: Tobi Bastos, MD;  Location: [facility];  Service: Orthopedics;  Laterality: Right;  Eugenie Norrie      at  [facility]   History reviewed. No pertinent family history. Social History  Substance Use Topics  . Smoking status: Never Smoker   . Smokeless tobacco: Never Used  . Alcohol Use: No   OB History    No data available     Review of Systems  Unable to perform ROS: Mental status change  Constitutional: Positive for fatigue. Negative for fever and chills.  HENT: Negative for congestion and rhinorrhea.   Eyes: Negative for redness and visual disturbance.  Respiratory: Negative for shortness of breath and wheezing.   Cardiovascular: Negative for chest pain and palpitations.  Gastrointestinal: Negative for nausea and vomiting.  Genitourinary: Negative for dysuria and urgency.  Musculoskeletal: Negative for myalgias and arthralgias.  Skin: Negative for pallor and wound.  Neurological: Negative for dizziness and headaches.      Allergies  Mirtazapine and Nitrofurantoin  Home Medications   Prior to Admission medications   Medication Sig Start Date End Date Taking? Authorizing Provider  acetaminophen (TYLENOL) 500 MG tablet Take 500 mg by mouth every 6 (six) hours as needed for moderate pain.    Yes Historical Provider, MD  albuterol (PROVENTIL) (2.5 MG/3ML) 0.083% nebulizer solution Take 3 mLs by nebulization 4 (four) times daily as needed. Shortness of breathe. 11/02/14  Yes Historical Provider, MD  aspirin 325 MG tablet Take 325 mg by mouth daily.   Yes Historical Provider, MD  Cranberry 400 MG TABS Take 400 mg by mouth daily.   Yes Historical Provider, MD  docusate sodium (STOOL SOFTENER) 100 MG capsule Take 100 mg by mouth 2 (two) times daily. 04/25/13  Yes Historical Provider, MD  meclizine (ANTIVERT) 12.5 MG tablet Take 12.5 mg by mouth every 4 (four) hours as needed for dizziness or nausea.   Yes Historical Provider, MD  Melatonin 3 MG CAPS Take 6 mg by mouth at bedtime.   Yes Historical Provider, MD  olmesartan (BENICAR) 40 MG tablet Take 40 mg by mouth daily after breakfast.   Yes  Historical Provider, MD  PREMARIN vaginal cream Place 1 Applicatorful vaginally 2 (two) times a week. Wed and Saturday 12/30/14  Yes Historical Provider, MD  sennosides-docusate sodium (SENOKOT-S) 8.6-50 MG tablet Take 1 tablet by mouth daily as needed for constipation.   Yes Historical Provider, MD  sertraline (ZOLOFT) 50 MG tablet Take 50 mg by mouth daily.   Yes Historical Provider, MD  traMADol (ULTRAM) 50 MG tablet Take 50 mg by mouth every 6 (six) hours as needed for moderate pain.    Yes Historical Provider, MD  Cranberry 475 MG CAPS Take by mouth daily.     Historical Provider, MD  senna (SENOKOT) 8.6 MG tablet Take 1 tablet by mouth as needed for constipation.    Historical Provider, MD   BP 96/61 mmHg  Pulse 94  Temp(Src) 97.4 F (36.3 C) (Oral)  Resp 21  Ht 5\' 6"  (1.676 m)  Wt 200 lb (90.719 kg)  BMI 32.30 kg/m2  SpO2 100% Physical Exam  Constitutional: She appears well-developed and well-nourished. No distress.  HENT:  Head: Normocephalic and atraumatic.  Eyes: EOM are normal. Pupils are equal, round, and reactive to light.  Neck: Normal range of motion. Neck supple.  Cardiovascular: Normal rate and regular rhythm.  Exam reveals no gallop and no friction rub.   No murmur heard. Pulmonary/Chest: Effort normal. She has no wheezes. She has no rales.  Abdominal: Soft. She exhibits no distension. There is no tenderness. There is no rebound and no guarding.  Musculoskeletal: She exhibits no edema or tenderness.  Neurological: She is alert.  Skin: Skin is warm and dry. She is not diaphoretic.  Psychiatric: She has a normal mood and affect. Her behavior is normal.  Nursing note and vitals reviewed.   ED Course  Procedures (including critical care time) Labs Review Labs Reviewed  COMPREHENSIVE METABOLIC PANEL - Abnormal; Notable for the following:    CO2 17 (*)    BUN 132 (*)    Creatinine, Ser 6.06 (*)    Total Protein 8.2 (*)    Albumin 3.3 (*)    AST 14 (*)    ALT 12  (*)    GFR calc non Af Amer 6 (*)    GFR calc Af Amer 7 (*)    Anion gap 17 (*)    All other components within normal limits  CBC WITH DIFFERENTIAL/PLATELET - Abnormal; Notable for the following:    Hemoglobin 11.8 (*)    All other components within normal limits  URINE CULTURE  CULTURE, BLOOD (ROUTINE X 2)  CULTURE, BLOOD (ROUTINE X 2)  URINALYSIS, ROUTINE W REFLEX MICROSCOPIC (NOT AT [facility])  URINE MICROSCOPIC-ADD ON  I-STAT CG4 LACTIC ACID, ED  I-STAT TROPOININ, ED    Imaging Review No results found. I have personally reviewed and evaluated these images and lab results as part of my medical decision-making.   EKG Interpretation   Date/Time:  Monday February 07 2015 13:42:35 EST Ventricular Rate:  94 PR Interval:  156 QRS Duration: 141 QT Interval:  381 QTC Calculation: 476 R Axis:   8 Text Interpretation:  Sinus rhythm Right bundle branch block No  significant change since last tracing Confirmed by Parul Porcelli MD, Quillian Quince  [id number]) on 02/07/2015 3:15:32 PM      MDM   Final diagnoses:  Acute renal failure, unspecified acute renal failure type [facility])    80 yo F with a chief complaints of altered mental status. Patient was found to be in acute renal failure.  Patient's initial blood pressure in the [id number] systolic will treat as possibly septic. Ordered 30 mL/kg of IV fluids. Given vancomycin and Zosyn.  Patient unable to communicate whether not she would want dialysis. Patient has a most form that asked for all care to be performed. Will assume that this likely means dialysis as well. Nephrology consulted.   Discussed the case with nephrology feel that this patient is unlikely to be a candidate for dialysis as well as with her history of likely dehydration suggested keeping her here at [facility].  Admit.  The patients results and plan were reviewed and discussed.   Any x-rays performed were independently reviewed by myself.   CRITICAL CARE Performed by: Cecilio Asper   Total critical care time: 30 minutes  Critical care time was exclusive of separately billable procedures and treating other patients.  Critical care was necessary to treat or prevent imminent or life-threatening deterioration.  Critical care was time spent personally by me on the following activities: development of treatment plan with patient and/or surrogate as well as nursing, discussions with consultants, evaluation of patient's response to treatment, examination of patient, obtaining history from patient or surrogate, ordering and performing treatments and interventions, ordering and review of laboratory studies, ordering and review of radiographic studies, pulse oximetry and re-evaluation of patient's condition.   Differential diagnosis were considered with the presenting HPI.  Medications  vancomycin (VANCOCIN) 2,000 mg in sodium chloride 0.9 % 500 mL IVPB (not administered)  sodium chloride 0.9 % bolus 1,000 mL (not administered)  piperacillin-tazobactam (ZOSYN) IVPB 3.375 g (3.375 g Intravenous New Bag/Given 02/07/15 1455)  sodium chloride 0.9 % bolus 2,721 mL (2,721 mLs Intravenous New Bag/Given 02/07/15 1423)    Filed Vitals:   02/07/15 1357 02/07/15 1443 02/07/15 1500 02/07/15 1530  BP:  97/58 92/51 96/61   Pulse:  104 96 94  Temp:      TempSrc:      Resp:  14 16 21   Height: 5\' 6"  (1.676 m)     Weight: 200 lb (90.719 kg)     SpO2:  99% 98% 100%    Final diagnoses:  Acute renal failure, unspecified acute renal failure type [facility])    Admission/ observation were discussed with the admitting physician, patient and/or family and they are comfortable with the plan.    Deno Etienne, DO 02/07/15 1537

## 2015-02-07 NOTE — H&P (Addendum)
[facility] History and Physical  Penny Delgado [id number] DOB: [birth year] DOA: 02/07/2015  Referring physician: Dr. Tyrone Nine PCP: Woody Seller, MD   Chief Complaint:  Altered mental status  HPI: Penny Delgado is a 80 y.o. female with past medical history of hypertension, diabetes, dementia, failure to thrive, who presents to the emergency department with failure to thrive. Per nursing home facility patient has been refusing to eat or drink, refusing to take medications. Unclear baseline.   Evaluation in the emergency department; creatinine 6.0, BUN;  132, lactic acid at 0.75, UA with too numerous to count white blood cell. CT head no acute intracranial abnormality. Chest x-ray no acute abnormality. Patient was found to have  systolic blood pressure in the 80s, which responded to IV fluids.   Review of Systems:  Negative except as per history of present illness  Past Medical History  Diagnosis Date  . Rhabdomyolysis 08-15-2010  . Acute renal failure ([place]) 08-15-2010 secondary to rhabdomylosis  . Hypertension   . Diabetes mellitus   . Asthma   . Hypercholesteremia   . Thrombocytopenia ([place])   . Primary osteoarthritis of right hip   . DEMENTIA   . Aseptic necrosis of femur ([place])   . Failure to thrive   . Abnormal gait   . CHF (congestive heart failure) ([place])   . Migraine   . UTI (urinary tract infection)    Past Surgical History  Procedure Laterality Date  . Bilateral knee arthroplasty  one done march 2004    2004  . Back surgery   15 yrs ago    not sure upper or lower back  . Cholecystectomy  yrs ago  . Abdominal hysterectomy  yrs ago  . Total hip arthroplasty  04/03/2011    Procedure: TOTAL HIP ARTHROPLASTY;  Surgeon: Tobi Bastos, MD;  Location: [facility];  Service: Orthopedics;  Laterality: Right;  Eugenie Norrie      at [place] History:  reports that she has never smoked. She has never used smokeless tobacco. She reports that she does not drink  alcohol or use illicit drugs.  Allergies  Allergen Reactions  . Mirtazapine     Per MAR  . Nitrofurantoin     unknown    Family History; unable to obtain from patient.   Prior to Admission medications   Medication Sig Start Date End Date Taking? Authorizing Provider  acetaminophen (TYLENOL) 500 MG tablet Take 500 mg by mouth every 6 (six) hours as needed for moderate pain.    Yes Historical Provider, MD  albuterol (PROVENTIL) (2.5 MG/3ML) 0.083% nebulizer solution Take 3 mLs by nebulization 4 (four) times daily as needed. Shortness of breathe. 11/02/14  Yes Historical Provider, MD  aspirin 325 MG tablet Take 325 mg by mouth daily.   Yes Historical Provider, MD  Cranberry 400 MG TABS Take 400 mg by mouth daily.   Yes Historical Provider, MD  docusate sodium (STOOL SOFTENER) 100 MG capsule Take 100 mg by mouth 2 (two) times daily. 04/25/13  Yes Historical Provider, MD  meclizine (ANTIVERT) 12.5 MG tablet Take 12.5 mg by mouth every 4 (four) hours as needed for dizziness or nausea.   Yes Historical Provider, MD  Melatonin 3 MG CAPS Take 6 mg by mouth at bedtime.   Yes Historical Provider, MD  olmesartan (BENICAR) 40 MG tablet Take 40 mg by mouth daily after breakfast.   Yes Historical Provider, MD  PREMARIN vaginal cream Place 1 Applicatorful vaginally 2 (two)  times a week. Wed and Saturday 12/30/14  Yes Historical Provider, MD  sennosides-docusate sodium (SENOKOT-S) 8.6-50 MG tablet Take 1 tablet by mouth daily as needed for constipation.   Yes Historical Provider, MD  sertraline (ZOLOFT) 50 MG tablet Take 50 mg by mouth daily.   Yes Historical Provider, MD  traMADol (ULTRAM) 50 MG tablet Take 50 mg by mouth every 6 (six) hours as needed for moderate pain.    Yes Historical Provider, MD  Cranberry 475 MG CAPS Take by mouth daily.     Historical Provider, MD  senna (SENOKOT) 8.6 MG tablet Take 1 tablet by mouth as needed for constipation.    Historical Provider, MD   Physical Exam: Filed  Vitals:   02/07/15 1357 02/07/15 1443 02/07/15 1500 02/07/15 1530  BP:  97/58 92/51 96/61   Pulse:  104 96 94  Temp:      TempSrc:      Resp:  14 16 21   Height: 5\' 6"  (1.676 m)     Weight: 90.719 kg (200 lb)     SpO2:  99% 98% 100%    Wt Readings from Last 3 Encounters:  02/07/15 90.719 kg (200 lb)  03/19/13 98.884 kg (218 lb)  04/03/11 86.637 kg (191 lb)    General:  Appears calm and comfortable, open eyes, answer some questions.  Eyes: PERRL, normal lids, irises & conjunctiva ENT: grossly normal hearing, lips & tongue with white plaques.  Neck: no LAD, masses or thyromegaly Cardiovascular: RRR, no m/r/g. No LE edema. Telemetry: SR, no arrhythmias  Respiratory: CTA bilaterally, no w/r/r. Normal respiratory effort. Abdomen: soft, ntnd Skin: multiples ecchymosis upper extremities.  Musculoskeletal: grossly normal tone BUE/BLE Psychiatric: grossly normal mood and affect, speech fluent and appropriate Neurologic: limited due to MS. Rigidity upper extremities.            Labs on Admission:  Basic Metabolic Panel:  Recent Labs Lab 02/07/15 1348  NA 138  K 5.1  CL 104  CO2 17*  GLUCOSE 96  BUN 132*  CREATININE 6.06*  CALCIUM 9.0   Liver Function Tests:  Recent Labs Lab 02/07/15 1348  AST 14*  ALT 12*  ALKPHOS 79  BILITOT 0.6  PROT 8.2*  ALBUMIN 3.3*   No results for input(s): LIPASE, AMYLASE in the last 168 hours. No results for input(s): AMMONIA in the last 168 hours. CBC:  Recent Labs Lab 02/07/15 1348  WBC 7.6  NEUTROABS 5.5  HGB 11.8*  HCT 36.3  MCV 92.8  PLT 215   Cardiac Enzymes: No results for input(s): CKTOTAL, CKMB, CKMBINDEX, TROPONINI in the last 168 hours.  BNP (last 3 results) No results for input(s): BNP in the last 8760 hours.  ProBNP (last 3 results) No results for input(s): PROBNP in the last 8760 hours.  CBG: No results for input(s): GLUCAP in the last 168 hours.  Radiological Exams on Admission: Ct Head Wo  Contrast  02/07/2015  CLINICAL DATA:  Failure to thrive.  Dementia. EXAM: CT HEAD WITHOUT CONTRAST TECHNIQUE: Contiguous axial images were obtained from the base of the skull through the vertex without intravenous contrast. COMPARISON:  CT head 08/15/2010 FINDINGS: Mild atrophy. Negative for hydrocephalus. Mild chronic microvascular ischemic change in the white matter. Negative for acute infarct.  Negative for hemorrhage or mass. Negative calvarium. IMPRESSION: Mild atrophy and chronic microvascular ischemia. No acute abnormality Electronically Signed   By: Franchot Gallo M.D.   On: 02/07/2015 16:06   Dg Chest Port 1 View  02/07/2015  CLINICAL DATA:  Altered mental status. EXAM: PORTABLE CHEST 1 VIEW COMPARISON:  08/15/2010 FINDINGS: The heart size and pulmonary vascularity are normal and the lungs are clear. Severe chronic arthritic changes of both shoulders. No acute osseous abnormality. Extensive calcification in the arch of the aorta. IMPRESSION: No acute abnormalities. Aortic atherosclerosis. Chronic severe arthritis of both shoulders. Electronically Signed   By: Lorriane Shire M.D.   On: 02/07/2015 15:58    EKG: Independently reviewed. Sinus rhythm , RBBB  Assessment/Plan Active Problems:   Pulmonary HTN (HCC)   Acute renal failure (ARF) (HCC)   UTI (lower urinary tract infection)   Encephalopathy acute   Renal failure  1-Acute encephalopathy; Patient with history of dementia. Unclear baseline.  Suspect secondary to renal failure, UTI.  NPO due to AMS.   2-Acute Renal Failure:  In setting of hypovolemia, ACE, poor oral intake hypotension.  IV fluids.  Strict I an dO.  Avoid hypotension and nephrotoxins.   3-Sepsis; Patient with hypotension, renal failure, source UTI.  Admit to step down unit. IV fluids, IV zosyn.  Chest x ray negative for PNA.  Follow up Blood culture and urine culture.   4-Dementia, FTT; Patient has been refusing medications and refuse to eat. Unclear if  worsening dementia. Need to find out baseline from family.  Might need palliative care consult for goals of care.     Code Status: Full code DVT Prophylaxis: Heparin.  Family Communication: I try to contact husband. Discussed care with son Dewitt Hoes , he and his fathers are POA. Patient is DNI, ok with CPR< , medications. I introduce concept of palliative care.  Disposition Plan: Suspect 2-3 days inpatient  Time spent: 75 minutes.   Niel Hummer A [facility] Pager [phone]

## 2015-02-07 NOTE — Progress Notes (Signed)
ANTIBIOTIC CONSULT NOTE - INITIAL  Pharmacy Consult for Zosyn Indication: rule out sepsis  Allergies  Allergen Reactions  . Mirtazapine     Per MAR  . Nitrofurantoin     unknown    Patient Measurements: Height: 5\' 6"  (167.6 cm) Weight: 200 lb (90.719 kg) IBW/kg (Calculated) : 59.3   Vital Signs: Temp: 97.4 F (36.3 C) (12/12 1337) Temp Source: Oral (12/12 1337) BP: 96/61 mmHg (12/12 1530) Pulse Rate: 94 (12/12 1530) Intake/Output from previous day:   Intake/Output from this shift: Total I/O In: -  Out: 15 [Urine:15]  Labs:  Recent Labs  02/07/15 1348  WBC 7.6  HGB 11.8*  PLT 215  CREATININE 6.06*   Estimated Creatinine Clearance: 8.8 mL/min (by C-G formula based on Cr of 6.06). No results for input(s): VANCOTROUGH, VANCOPEAK, VANCORANDOM, GENTTROUGH, GENTPEAK, GENTRANDOM, TOBRATROUGH, TOBRAPEAK, TOBRARND, AMIKACINPEAK, AMIKACINTROU, AMIKACIN in the last 72 hours.   Microbiology: No results found for this or any previous visit (from the past 720 hour(s)).  Medical History: Past Medical History  Diagnosis Date  . Rhabdomyolysis 08-15-2010  . Acute renal failure ([place]) 08-15-2010 secondary to rhabdomylosis  . Hypertension   . Diabetes mellitus   . Asthma   . Hypercholesteremia   . Thrombocytopenia ([place])   . Primary osteoarthritis of right hip   . DEMENTIA   . Aseptic necrosis of femur ([place])   . Failure to thrive   . Abnormal gait   . CHF (congestive heart failure) ([place])   . Migraine   . UTI (urinary tract infection)     Assessment: 67 yoF with hx of HTN, DM, dementia with baseline creat of 1.52 presenting to the ED from the SNF with decreased PO intake and refusing medications. Found to have severe azotemia, AKI with SCr 6.06 likely secondary to volume depletion/hypotension. Nephrology consulted and signed off.  Not a dialysis candidate due to comorbidities.  Patient received vancomycin 2g x 1 and Zosyn 3.375g x 1 in ED. Pharmacy consulted to dose Zosyn  for r/o sepsis. CXR no acute abnormalities. UA suggestive of UTI.  WBC WNL, current temp 97.4. CrCl~8 ml/min.  Antimicrobials this admission: 12/12 Vanc x 1 12/12 Zosyn >>  Microbiology Results: 12/12 BCx: collected 12/12 UCx: collected   Goal of Therapy:  Doses adjusted per renal function Eradication of infection  Plan:  1.  Zosyn 2.25g IV q8h. 2.  F/u SCr, culture results.  Hershal Coria 02/07/2015,4:55 PM

## 2015-02-07 NOTE — ED Notes (Signed)
Per EMS. Pt from [facility] home. Hx of dementia. Pt normally non-verbal, just looks around. Staff reports pt has had a decreased PO intake this week and has been refusing medications. Staff was able to give her am meds this am. Pt has MOST form which indicates full code.

## 2015-02-07 NOTE — ED Notes (Addendum)
Pt's O2 sat 100% with good waveform on Room Air

## 2015-02-07 NOTE — ED Notes (Signed)
Bed: [id number] Expected date:  Expected time:  Means of arrival:  Comments: EMS 14 FTT

## 2015-02-07 NOTE — Progress Notes (Signed)
Spoke with son at bedside visiting pt about his concern that snf called him to come to hospital to visit pt  RN explained pt UTI, renal failure issues  SW consult entered in EPIC Pt from [facility] home. Hx of dementia

## 2015-02-07 NOTE — ED Notes (Signed)
MD at bedside. 

## 2015-02-07 NOTE — ED Notes (Signed)
Hospitalist at bedside 

## 2015-02-08 DIAGNOSIS — F0280 Dementia in other diseases classified elsewhere without behavioral disturbance: Secondary | ICD-10-CM

## 2015-02-08 DIAGNOSIS — F0393 Unspecified dementia, unspecified severity, with mood disturbance: Secondary | ICD-10-CM

## 2015-02-08 DIAGNOSIS — L8990 Pressure ulcer of unspecified site, unspecified stage: Secondary | ICD-10-CM | POA: Insufficient documentation

## 2015-02-08 DIAGNOSIS — Z515 Encounter for palliative care: Secondary | ICD-10-CM

## 2015-02-08 DIAGNOSIS — F329 Major depressive disorder, single episode, unspecified: Secondary | ICD-10-CM

## 2015-02-08 DIAGNOSIS — G894 Chronic pain syndrome: Secondary | ICD-10-CM

## 2015-02-08 LAB — COMPREHENSIVE METABOLIC PANEL
ALBUMIN: 2.4 g/dL — AB (ref 3.5–5.0)
ALK PHOS: 53 U/L (ref 38–126)
ALT: 8 U/L — ABNORMAL LOW (ref 14–54)
AST: 10 U/L — AB (ref 15–41)
Anion gap: 11 (ref 5–15)
BILIRUBIN TOTAL: 0.8 mg/dL (ref 0.3–1.2)
BUN: 115 mg/dL — AB (ref 6–20)
CALCIUM: 7.4 mg/dL — AB (ref 8.9–10.3)
CO2: 13 mmol/L — ABNORMAL LOW (ref 22–32)
Chloride: 117 mmol/L — ABNORMAL HIGH (ref 101–111)
Creatinine, Ser: 4.87 mg/dL — ABNORMAL HIGH (ref 0.44–1.00)
GFR calc Af Amer: 9 mL/min — ABNORMAL LOW (ref >60)
GFR, EST NON AFRICAN AMERICAN: 8 mL/min — AB (ref >60)
GLUCOSE: 80 mg/dL (ref 65–99)
Potassium: 4 mmol/L (ref 3.5–5.1)
Sodium: 141 mmol/L (ref 135–145)
TOTAL PROTEIN: 5.6 g/dL — AB (ref 6.5–8.1)

## 2015-02-08 LAB — CBC
HCT: 27 % — ABNORMAL LOW (ref 36.0–46.0)
Hemoglobin: 8.5 g/dL — ABNORMAL LOW (ref 12.0–15.0)
MCH: 29.3 pg (ref 26.0–34.0)
MCHC: 31.5 g/dL (ref 30.0–36.0)
MCV: 93.1 fL (ref 78.0–100.0)
Platelets: 145 10*3/uL — ABNORMAL LOW (ref 150–400)
RBC: 2.9 MIL/uL — ABNORMAL LOW (ref 3.87–5.11)
RDW: 14.3 % (ref 11.5–15.5)
WBC: 6.4 10*3/uL (ref 4.0–10.5)

## 2015-02-08 LAB — TSH: TSH: 0.926 u[IU]/mL (ref 0.350–4.500)

## 2015-02-08 MED ORDER — OLANZAPINE 2.5 MG PO TABS
2.5000 mg | ORAL_TABLET | Freq: Every day | ORAL | Status: DC
  Administered 2015-02-08 – 2015-02-09 (×2): 2.5 mg via ORAL
  Filled 2015-02-08 (×3): qty 1

## 2015-02-08 MED ORDER — SODIUM CHLORIDE 0.9 % IV BOLUS (SEPSIS)
500.0000 mL | Freq: Once | INTRAVENOUS | Status: AC
  Administered 2015-02-08: 500 mL via INTRAVENOUS

## 2015-02-08 MED ORDER — SODIUM BICARBONATE 8.4 % IV SOLN
INTRAVENOUS | Status: DC
  Administered 2015-02-08 (×2): via INTRAVENOUS
  Filled 2015-02-08 (×3): qty 150

## 2015-02-08 MED ORDER — DULOXETINE HCL 20 MG PO CPEP
20.0000 mg | ORAL_CAPSULE | Freq: Every day | ORAL | Status: DC
  Administered 2015-02-08 – 2015-02-09 (×2): 20 mg via ORAL
  Filled 2015-02-08 (×3): qty 1

## 2015-02-08 MED ORDER — OXYCODONE HCL 20 MG/ML PO CONC: 10.0000 mg | ORAL | Status: DC | PRN

## 2015-02-08 MED ORDER — SENNOSIDES-DOCUSATE SODIUM 8.6-50 MG PO TABS
2.0000 | ORAL_TABLET | Freq: Every day | ORAL | Status: DC
Start: 2015-02-08 — End: 2015-02-10
  Administered 2015-02-08 – 2015-02-09 (×2): 2 via ORAL
  Filled 2015-02-08 (×2): qty 2

## 2015-02-08 MED ORDER — OXYCODONE-ACETAMINOPHEN 5-325 MG PO TABS
1.0000 | ORAL_TABLET | Freq: Three times a day (TID) | ORAL | Status: DC
  Administered 2015-02-08 – 2015-02-09 (×5): 1 via ORAL
  Filled 2015-02-08 (×5): qty 1

## 2015-02-08 NOTE — Clinical Documentation Improvement (Signed)
Internal Medicine  Can the diagnosis of pressure ulcer be further specified?   Document if pressure ulcer with stage is Present on Admission   Document Site with laterality - Elbow, Back (upper/lower), Sacral, Hip, Buttock, Ankle, Heel, Head, Other (Specify)  Pressure Ulcer Stage - Stage1, Stage 2, Stage 3, Stage 4, Unstageable, Unspecified, Unable to Clinically Determine  Document any associated diagnoses/conditions such as: with gangrene  Other  Clinically Undetermined  Please update your documentation within the medical record to reflect your response to this query. Thank you.  Supporting Information:  (As per notes)  "Pressure Ulcer"  Please exercise your independent, professional judgment when responding. A specific answer is not anticipated or expected.  Thank You, Alessandra Grout, RN, BSN, CCDS,Clinical Documentation Specialist:  [phone]  [phone]=[facility] [place] [facility]- Health Information Management

## 2015-02-08 NOTE — Evaluation (Signed)
Clinical/Bedside Swallow Evaluation Patient Details  Name: Penny Delgado MRN: [id number] Date of Birth: 1935/01/17  Today's Date: 02/08/2015 Time: SLP Start Time (ACUTE ONLY): 1300 SLP Stop Time (ACUTE ONLY): 1313 SLP Time Calculation (min) (ACUTE ONLY): 13 min  Past Medical History:  Past Medical History  Diagnosis Date  . Rhabdomyolysis 08-15-2010  . Acute renal failure ([place]) 08-15-2010 secondary to rhabdomylosis  . Hypertension   . Diabetes mellitus   . Asthma   . Hypercholesteremia   . Thrombocytopenia ([place])   . Primary osteoarthritis of right hip   . DEMENTIA   . Aseptic necrosis of femur ([place])   . Failure to thrive   . Abnormal gait   . CHF (congestive heart failure) ([place])   . Migraine   . UTI (urinary tract infection)    Past Surgical History:  Past Surgical History  Procedure Laterality Date  . Bilateral knee arthroplasty  one done march 2004    2004  . Back surgery   15 yrs ago    not sure upper or lower back  . Cholecystectomy  yrs ago  . Abdominal hysterectomy  yrs ago  . Total hip arthroplasty  04/03/2011    Procedure: TOTAL HIP ARTHROPLASTY;  Surgeon: Tobi Bastos, MD;  Location: [facility];  Service: Orthopedics;  Laterality: Right;  Eugenie Norrie      at [facility]   HPI:  Penny Delgado is a 80 y.o. female with past medical history of hypertension, diabetes, dementia, failure to thrive, who presents to the emergency department with failure to thrive. Per nursing home facility patient has been refusing to eat or drink, refusing to take medications. Unclear baseline.Pt has UTI.    Assessment / Plan / Recommendation Clinical Impression  Pt demonstrates normal swallow function with liquids and purees, no signs of aspiration observed.  She refused to masticate solids. Pt recommended to consume puree (dys 1) diet and thin liquids. Will f/u for diet upgrade as mentation improves.     Aspiration Risk  Mild aspiration risk    Diet Recommendation     Medication  Administration: Whole meds with liquid    Other  Recommendations Oral Care Recommendations: Oral care BID   Follow up Recommendations  Skilled Nursing facility    Frequency and Duration min 2x/week  2 weeks       Prognosis Prognosis for Safe Diet Advancement: Good      Swallow Study   General HPI: Penny Delgado is a 81 y.o. female with past medical history of hypertension, diabetes, dementia, failure to thrive, who presents to the emergency department with failure to thrive. Per nursing home facility patient has been refusing to eat or drink, refusing to take medications. Unclear baseline.Pt has UTI.  Type of Study: Bedside Swallow Evaluation Diet Prior to this Study: NPO Temperature Spikes Noted: No Respiratory Status: Room air History of Recent Intubation: No Behavior/Cognition: Alert Oral Cavity Assessment: Within Functional Limits Oral Care Completed by SLP: Yes Oral Cavity - Dentition: Adequate natural dentition Self-Feeding Abilities: Needs assist Patient Positioning: Upright in bed Baseline Vocal Quality: Normal Volitional Cough: Strong Volitional Swallow: Able to elicit    Oral/Motor/Sensory Function Overall Oral Motor/Sensory Function: Within functional limits   Ice Chips     Thin Liquid Thin Liquid: Within functional limits Presentation: Cup;Straw    Nectar Thick Nectar Thick Liquid: Not tested   Honey Thick Honey Thick Liquid: Not tested   Puree Puree: Within functional limits   Solid Solid: Impaired Presentation:  (  pt would not bite cracker)      Herbie Baltimore, MA CCC-SLP [phone]  Chauncey Bruno, Katherene Ponto 02/08/2015,1:13 PM

## 2015-02-08 NOTE — Consult Note (Signed)
Consultation Note Date: 02/08/2015   Patient Name: Penny Delgado  DOB: 03/14/1934  MRN: [id number]  Age / Sex: 80 y.o., female  PCP: Christain Sacramento, MD Referring Physician: Elmarie Shiley, MD  Reason for Consultation: Establishing goals of care, Hospice Evaluation and Pain control    Clinical Assessment/Narrative: 80 yo woman with multiple advanced chronic medical problems and worsening dementia, now bedbound at [facility] for the past 3 years. Per family she has no QOL, she yells out in pain with even minimal movement, she has stopped eating or interacting. Her husband is frail and burned out from being a caregiver-her son lives in [place]. She was admitted with AMS, found to have a UTI/sepsis.  Palliative consult for symptom management and goals of care.  Contacts/Participants in Discussion: Son and her husband Primary Decision Maker:  Son is HCPOA Relationship to Patient  HCPOA: yes    SUMMARY OF RECOMMENDATIONS  Code Status/Advance Care Planning: DNR    Code Status Orders        Start     Ordered   02/08/15 1424  Do not attempt resuscitation (DNR)   Continuous    Question Answer Comment  In the event of cardiac or respiratory ARREST Do not call a "code blue"   In the event of cardiac or respiratory ARREST Do not perform Intubation, CPR, defibrillation or ACLS   In the event of cardiac or respiratory ARREST Use medication by any route, position, wound care, and other measures to relive pain and suffering. May use oxygen, suction and manual treatment of airway obstruction as needed for comfort.      02/08/15 1423    Advance Directive Documentation        Most Recent Value   Type of Advance Directive  Out of facility DNR (pink MOST or yellow form)   Pre-existing out of facility DNR order (yellow form or pink MOST form)     "MOST" Form in Place?        Other  Directives:None  Symptom Management:  1. Pain: Severe, reports generalized pain, has contractures and OA frozen shoulders bilaterally. Will start scheduled oxycodone TID with oxyfast for breakthrough pain. Bowel regimen started.  2. Depression: Intolerance to mirtazapine, will instead use Cymbalta which may also help with neuropathic pain. Depression may be playing significant role in her decline.   Palliative Prophylaxis:   Aspiration, Bowel Regimen, Delirium Protocol, Eye Care, Frequent Pain Assessment and Oral Care  Additional Recommendations (Limitations, Scope, Preferences):  Avoid Hospitalization, Minimize Medications and Initiate Comfort Feeding  Recommend hospice care given her rapid decline over the past few months.  Psycho-social/Spiritual:  Support System: Strong Desire for further Chaplaincy support:yes Additional Recommendations: Caregiving  Support/Resources  Prognosis: <6 months  Discharge Planning: [place] with Hospice   Chief Complaint/ Primary Diagnoses: Present on Admission:  . Acute renal failure (ARF) ([place]) . Pulmonary HTN ([place]) . Renal failure  I have reviewed the medical record, interviewed the patient and family, and examined the patient. The following aspects are pertinent.  Past Medical History  Diagnosis Date  . Rhabdomyolysis 08-15-2010  . Acute renal failure ([place]) 08-15-2010 secondary to rhabdomylosis  . Hypertension   . Diabetes mellitus   . Asthma   . Hypercholesteremia   . Thrombocytopenia ([place])   . Primary osteoarthritis of right hip   . DEMENTIA   . Aseptic necrosis of femur ([place])   . Failure to thrive   . Abnormal gait   . CHF (congestive  heart failure) ([place])   . Migraine   . UTI (urinary tract infection)    Social History   Social History  . Marital Status: Married    Spouse Name: N/A  . Number of Children: N/A  . Years of Education: N/A   Social History Main Topics  . Smoking status: Never Smoker   .  Smokeless tobacco: Never Used  . Alcohol Use: No  . Drug Use: No  . Sexual Activity: No   Other Topics Concern  . None   Social History Narrative   History reviewed. No pertinent family history. Scheduled Meds: . aspirin  325 mg Oral Daily  . DULoxetine  20 mg Oral Daily  . OLANZapine  2.5 mg Oral QHS  . oxyCODONE-acetaminophen  1 tablet Oral TID  . piperacillin-tazobactam (ZOSYN)  IV  2.25 g Intravenous Q8H  . senna-docusate  2 tablet Oral QHS   Continuous Infusions: .  sodium bicarbonate  infusion 1000 mL 100 mL/hr at 02/08/15 0849   PRN Meds:.acetaminophen **OR** acetaminophen, albuterol, ondansetron **OR** ondansetron (ZOFRAN) IV, oxyCODONE Medications Prior to Admission:  Prior to Admission medications   Medication Sig Start Date End Date Taking? Authorizing Provider  acetaminophen (TYLENOL) 500 MG tablet Take 500 mg by mouth every 6 (six) hours as needed for moderate pain.    Yes Historical Provider, MD  albuterol (PROVENTIL) (2.5 MG/3ML) 0.083% nebulizer solution Take 3 mLs by nebulization 4 (four) times daily as needed. Shortness of breathe. 11/02/14  Yes Historical Provider, MD  aspirin 325 MG tablet Take 325 mg by mouth daily.   Yes Historical Provider, MD  Cranberry 400 MG TABS Take 400 mg by mouth daily.   Yes Historical Provider, MD  docusate sodium (STOOL SOFTENER) 100 MG capsule Take 100 mg by mouth 2 (two) times daily. 04/25/13  Yes Historical Provider, MD  meclizine (ANTIVERT) 12.5 MG tablet Take 12.5 mg by mouth every 4 (four) hours as needed for dizziness or nausea.   Yes Historical Provider, MD  Melatonin 3 MG CAPS Take 6 mg by mouth at bedtime.   Yes Historical Provider, MD  olmesartan (BENICAR) 40 MG tablet Take 40 mg by mouth daily after breakfast.   Yes Historical Provider, MD  PREMARIN vaginal cream Place 1 Applicatorful vaginally 2 (two) times a week. Wed and Saturday 12/30/14  Yes Historical Provider, MD  sennosides-docusate sodium (SENOKOT-S) 8.6-50 MG  tablet Take 1 tablet by mouth daily as needed for constipation.   Yes Historical Provider, MD  sertraline (ZOLOFT) 50 MG tablet Take 50 mg by mouth daily.   Yes Historical Provider, MD  traMADol (ULTRAM) 50 MG tablet Take 50 mg by mouth every 6 (six) hours as needed for moderate pain.    Yes Historical Provider, MD  Cranberry 475 MG CAPS Take by mouth daily.     Historical Provider, MD  senna (SENOKOT) 8.6 MG tablet Take 1 tablet by mouth as needed for constipation.    Historical Provider, MD   Allergies  Allergen Reactions  . Mirtazapine     Per MAR  . Nitrofurantoin     unknown    Review of Systems  Unable to perform ROS   Physical Exam  Vital Signs: BP 137/70 mmHg  Pulse 88  Temp(Src) 98 F (36.7 C) (Oral)  Resp 18  Ht 5\' 5"  (1.651 m)  Wt 83 kg (182 lb 15.7 oz)  BMI 30.45 kg/m2  SpO2 100%  SpO2: SpO2: 100 % O2 Device:SpO2: 100 % O2 Flow Rate: .  IO: Intake/output summary:  Intake/Output Summary (Last 24 hours) at 02/08/15 1432 Last data filed at 02/08/15 1300  Gross per 24 hour  Intake 5087.67 ml  Output   1890 ml  Net 3197.67 ml    LBM: Last BM Date:  (unknown) Baseline Weight: Weight: 90.719 kg (200 lb) Most recent weight: Weight: 83 kg (182 lb 15.7 oz)      Palliative Assessment/Data:  Flowsheet Rows        Most Recent Value   Intake Tab    Referral Department  Hospitalist   Unit at Time of Referral  Med/Surg Unit   Palliative Care Primary Diagnosis  Sepsis/Infectious Disease   Date Notified  02/08/15   Palliative Care Type  New Palliative care   Reason for referral  Pain, Clarify Goals of Care   Date of Admission  02/07/15   Date first seen by Palliative Care  02/08/15   # of days Palliative referral response time  0 Day(s)   # of days IP prior to Palliative referral  1   Clinical Assessment    Palliative Performance Scale Score  30%   Pain Max last 24 hours  10   Pain Min Last 24 hours  10   Dyspnea Max Last 24 Hours  Not able to report    Dyspnea Min Last 24 hours  Not able to report   Nausea Max Last 24 Hours  Not able to report   Nausea Min Last 24 Hours  Not able to report   Anxiety Max Last 24 Hours  10   Anxiety Min Last 24 Hours  10   Other Max Last 24 Hours  Not able to report   Psychosocial & Spiritual Assessment    Social Work Plan of Care  Education on Hospice, Advance care planning   Palliative Care Outcomes    Patient/Family meeting held?  Yes   Palliative Care Outcomes  Improved pain interventions, Transitioned to hospice   Palliative Care follow-up planned  Yes, Facility      Additional Data Reviewed:  CBC:    Component Value Date/Time   WBC 6.4 02/08/2015 0515   HGB 8.5* 02/08/2015 0515   HCT 27.0* 02/08/2015 0515   PLT 145* 02/08/2015 0515   MCV 93.1 02/08/2015 0515   NEUTROABS 5.5 02/07/2015 1348   LYMPHSABS 1.5 02/07/2015 1348   MONOABS 0.2 02/07/2015 1348   EOSABS 0.3 02/07/2015 1348   BASOSABS 0.1 02/07/2015 1348   Comprehensive Metabolic Panel:    Component Value Date/Time   NA 141 02/08/2015 0515   K 4.0 02/08/2015 0515   CL 117* 02/08/2015 0515   CO2 13* 02/08/2015 0515   BUN 115* 02/08/2015 0515   CREATININE 4.87* 02/08/2015 0515   GLUCOSE 80 02/08/2015 0515   CALCIUM 7.4* 02/08/2015 0515   AST 10* 02/08/2015 0515   ALT 8* 02/08/2015 0515   ALKPHOS 53 02/08/2015 0515   BILITOT 0.8 02/08/2015 0515   PROT 5.6* 02/08/2015 0515   ALBUMIN 2.4* 02/08/2015 0515     Time In: 1:30 Time Out: 2:45 Time Total: 75 Greater than 50%  of this time was spent counseling and coordinating care related to the above assessment and plan.  Signed by: Roma Schanz, DO  02/08/2015, 2:32 PM  Please contact Palliative Medicine Team phone at [phone] for questions and concerns.

## 2015-02-08 NOTE — Progress Notes (Signed)
[facility] PROGRESS NOTE  Penny Delgado [id number] DOB: 1934-05-22 DOA: 02/07/2015 PCP: Woody Seller, MD  Assessment/Plan: Penny Delgado is a 80 y.o. female with past medical history of hypertension, diabetes, dementia, failure to thrive, who presents to the emergency department with failure to thrive. Per nursing home facility patient has been refusing to eat or drink, refusing to take medications. Unclear baseline.   Evaluation in the emergency department; creatinine 6.0, BUN; 132, lactic acid at 0.75, UA with too numerous to count white blood cell. CT head no acute intracranial abnormality. Chest x-ray no acute abnormality. Patient was found to have systolic blood pressure in the 80s, which responded to IV fluids.   1-Acute encephalopathy; Patient with history of dementia.  Suspect secondary to renal failure, UTI.  Alert today, will ask speech for diet recommendation.   2-Acute Renal Failure: Metabolic acidosis.  In setting of hypovolemia, ACE, poor oral intake hypotension.  IV fluids. change IV fluids to bicarb Gtt due to metabolic acidosis.  Strict I an dO. good urine out put: 1.09 L Avoid hypotension and nephrotoxins.  Cr has decrease from 6---to 4. BUN still elevated at 115.  3-Sepsis; Patient with hypotension, renal failure, source UTI.  Continue with IV fluids, IV zosyn.  Chest x ray negative for PNA.  Follow up Blood culture and urine culture.  BP stable.   4-Dementia, FTT; Patient has been refusing medications and refuse to eat at facility.  Per son patient is bedbound. She has good day and bad. Some times she is able to speaks, other she is more quiet. She recognized family.  need palliative care consult for goals of care.   5-Anemia; last Hb per records 9--to 10/  Suspect initial hb was related to hemoconcentration.  Hold heparin for DVT prophylaxis. SCD ordered.  Check Anemia panel.   Code Status: Partial Code.  Family Communication:  Will  Discuss care  with son 12-13 Disposition Plan: remain inpatient, treatment of renal failure. Palliative care consult.    Consultants:  Palliative care  Procedures:  none  Antibiotics:  Zosyn 12-12  HPI/Subjective: Alert, asking for food. Denies pain. confuse  Objective: Filed Vitals:   02/07/15 2029 02/08/15 0523  BP: 102/58 100/54  Pulse: 100 92  Temp: 98.2 F (36.8 C) 98 F (36.7 C)  Resp: 16 16    Intake/Output Summary (Last 24 hours) at 02/08/15 0741 Last data filed at 02/08/15 0700  Gross per 24 hour  Intake 5037.67 ml  Output   1090 ml  Net 3947.67 ml   Filed Weights   02/07/15 1357 02/07/15 1855  Weight: 90.719 kg (200 lb) 83 kg (182 lb 15.7 oz)    Exam:   General:  Alert in no distress, confuse  Cardiovascular: S 1, S 2 RRR  Respiratory: CTA  Abdomen: BS present, soft, nt  Musculoskeletal: no edema  Data Reviewed: Basic Metabolic Panel:  Recent Labs Lab 02/07/15 1348 02/08/15 0515  NA 138 141  K 5.1 4.0  CL 104 117*  CO2 17* 13*  GLUCOSE 96 80  BUN 132* 115*  CREATININE 6.06* 4.87*  CALCIUM 9.0 7.4*   Liver Function Tests:  Recent Labs Lab 02/07/15 1348 02/08/15 0515  AST 14* 10*  ALT 12* 8*  ALKPHOS 79 53  BILITOT 0.6 0.8  PROT 8.2* 5.6*  ALBUMIN 3.3* 2.4*   No results for input(s): LIPASE, AMYLASE in the last 168 hours. No results for input(s): AMMONIA in the last 168 hours. CBC:  Recent Labs  Lab 02/07/15 1348 02/08/15 0515  WBC 7.6 6.4  NEUTROABS 5.5  --   HGB 11.8* 8.5*  HCT 36.3 27.0*  MCV 92.8 93.1  PLT 215 145*   Cardiac Enzymes: No results for input(s): CKTOTAL, CKMB, CKMBINDEX, TROPONINI in the last 168 hours. BNP (last 3 results) No results for input(s): BNP in the last 8760 hours.  ProBNP (last 3 results) No results for input(s): PROBNP in the last 8760 hours.  CBG: No results for input(s): GLUCAP in the last 168 hours.  Recent Results (from the past 240 hour(s))  MRSA PCR Screening      Status: None   Collection Time: 02/07/15  6:58 PM  Result Value Ref Range Status   MRSA by PCR NEGATIVE NEGATIVE Final    Comment:        The GeneXpert MRSA Assay (FDA approved for NASAL specimens only), is one component of a comprehensive MRSA colonization surveillance program. It is not intended to diagnose MRSA infection nor to guide or monitor treatment for MRSA infections.      Studies: Ct Head Wo Contrast  02/07/2015  CLINICAL DATA:  Failure to thrive.  Dementia. EXAM: CT HEAD WITHOUT CONTRAST TECHNIQUE: Contiguous axial images were obtained from the base of the skull through the vertex without intravenous contrast. COMPARISON:  CT head 08/15/2010 FINDINGS: Mild atrophy. Negative for hydrocephalus. Mild chronic microvascular ischemic change in the white matter. Negative for acute infarct.  Negative for hemorrhage or mass. Negative calvarium. IMPRESSION: Mild atrophy and chronic microvascular ischemia. No acute abnormality Electronically Signed   By: Franchot Gallo M.D.   On: 02/07/2015 16:06   Dg Chest Port 1 View  02/07/2015  CLINICAL DATA:  Altered mental status. EXAM: PORTABLE CHEST 1 VIEW COMPARISON:  08/15/2010 FINDINGS: The heart size and pulmonary vascularity are normal and the lungs are clear. Severe chronic arthritic changes of both shoulders. No acute osseous abnormality. Extensive calcification in the arch of the aorta. IMPRESSION: No acute abnormalities. Aortic atherosclerosis. Chronic severe arthritis of both shoulders. Electronically Signed   By: Lorriane Shire M.D.   On: 02/07/2015 15:58    Scheduled Meds: . aspirin  325 mg Oral Daily  . heparin  5,000 Units Subcutaneous 3 times per day  . piperacillin-tazobactam (ZOSYN)  IV  2.25 g Intravenous Q8H   Continuous Infusions: .  sodium bicarbonate  infusion 1000 mL      Active Problems:   Pulmonary HTN (H[facility])   Acute renal failure (ARF) (H[facility])   UTI (lower urinary tract infection)   Encephalopathy acute    Renal failure   Pressure ulcer    Time spent: 35 minutes.     Niel Hummer A  [facility] Pager [phone]. If 7PM-7AM, please contact night-coverage at [email or web address], password [facility] 02/08/2015, 7:41 AM  LOS: 1 day

## 2015-02-08 NOTE — Progress Notes (Signed)
Initial Nutrition Assessment  DOCUMENTATION CODES:   Obesity unspecified  INTERVENTION:  - Diet advancement per SLP recommendations - Will order appropriate supplements with diet advancement - RD will continue to monitor for needs  NUTRITION DIAGNOSIS:   Inadequate oral intake related to inability to eat as evidenced by NPO status.  GOAL:   Patient will meet greater than or equal to 90% of their needs  MONITOR:   Diet advancement, Weight trends, Labs, Skin, I & O's  REASON FOR ASSESSMENT:   Low Braden  ASSESSMENT:   80 y.o. female with past medical history of hypertension, diabetes, dementia, failure to thrive, who presents to the emergency department with failure to thrive. Per nursing home facility patient has been refusing to eat or drink, refusing to take medications. Unclear baseline.   Pt seen for low Braden. BMI indicates obesity. Pt has been NPO and is now pending SLP evaluation and recommendations. Pt sleeping at time of RD visit with no family/visitors present. RD called pt's name and introduced self and informed pt that RD would be doing physical assessment. Pt did not awake during period of talking or during physical assessment. Chart indicates pt with hx of dementia and that at the nursing home she is often non-verbal.   Physical assessment shows no muscle or fat wasting, shows moderate edema to BLE. Weight recorded on admission and last available weight in chart is from 03/19/13. Since that time, pt has lost 36 lbs (16.5% body weight) which is not significant in 23 month time frame.  Pt unable to meet needs at this time. Medications reviewed. Labs reviewed; Cl: 117 mmol/L, BUN/creatinine elevated but trending down, Ca: 7.4 mg/dL, GFR: 8.   Diet Order:  Diet NPO time specified  Skin:  Wound (see comment) (Stage 1 pressure ulcers to bilateral heels and bilateral sacrum)  Last BM:  PTA  Height:   Ht Readings from Last 1 Encounters:  02/07/15 5\' 5"  (1.651 m)     Weight:   Wt Readings from Last 1 Encounters:  02/07/15 182 lb 15.7 oz (83 kg)    Ideal Body Weight:  56.82 kg (kg)  BMI:  Body mass index is 30.45 kg/(m^2).  Estimated Nutritional Needs:   Kcal:  1500-1700  Protein:  67-80 grams  Fluid:  2 L/day  EDUCATION NEEDS:   No education needs identified at this time     Jarome Matin, RD, LDN Inpatient Clinical Dietitian Pager # [phone] After hours/weekend pager # [phone]

## 2015-02-08 NOTE — Progress Notes (Signed)
Contact made with on-call provider to report BP was 73/49.

## 2015-02-08 NOTE — Progress Notes (Signed)
This RN is taking over the patient's care and agrees with the previous RN's assessment. Will continue to monitor. 

## 2015-02-09 DIAGNOSIS — F329 Major depressive disorder, single episode, unspecified: Secondary | ICD-10-CM

## 2015-02-09 DIAGNOSIS — G9340 Encephalopathy, unspecified: Secondary | ICD-10-CM

## 2015-02-09 DIAGNOSIS — L8990 Pressure ulcer of unspecified site, unspecified stage: Secondary | ICD-10-CM

## 2015-02-09 DIAGNOSIS — N170 Acute kidney failure with tubular necrosis: Secondary | ICD-10-CM

## 2015-02-09 DIAGNOSIS — F0280 Dementia in other diseases classified elsewhere without behavioral disturbance: Secondary | ICD-10-CM

## 2015-02-09 LAB — URINE CULTURE

## 2015-02-09 LAB — BASIC METABOLIC PANEL
Anion gap: 11 (ref 5–15)
BUN: 84 mg/dL — ABNORMAL HIGH (ref 6–20)
CHLORIDE: 113 mmol/L — AB (ref 101–111)
CO2: 21 mmol/L — ABNORMAL LOW (ref 22–32)
CREATININE: 3.81 mg/dL — AB (ref 0.44–1.00)
Calcium: 7.8 mg/dL — ABNORMAL LOW (ref 8.9–10.3)
GFR, EST AFRICAN AMERICAN: 12 mL/min — AB (ref >60)
GFR, EST NON AFRICAN AMERICAN: 11 mL/min — AB (ref >60)
Glucose, Bld: 101 mg/dL — ABNORMAL HIGH (ref 65–99)
POTASSIUM: 3 mmol/L — AB (ref 3.5–5.1)
SODIUM: 145 mmol/L (ref 135–145)

## 2015-02-09 LAB — CBC
HEMATOCRIT: 28.4 % — AB (ref 36.0–46.0)
HEMOGLOBIN: 9.1 g/dL — AB (ref 12.0–15.0)
MCH: 29.3 pg (ref 26.0–34.0)
MCHC: 32 g/dL (ref 30.0–36.0)
MCV: 91.3 fL (ref 78.0–100.0)
Platelets: 150 10*3/uL (ref 150–400)
RBC: 3.11 MIL/uL — AB (ref 3.87–5.11)
RDW: 14.6 % (ref 11.5–15.5)
WBC: 4 10*3/uL (ref 4.0–10.5)

## 2015-02-09 NOTE — Progress Notes (Signed)
Speech Language Pathology Treatment: Dysphagia  Patient Details Name: Penny Delgado MRN: [id number] DOB: 09/02/1934 Today's Date: 02/09/2015 Time: 1020-1030 SLP Time Calculation (min) (ACUTE ONLY): 10 min  Assessment / Plan / Recommendation Clinical Impression  Pt taking meds crushed in puree, coughing consistently with HOB reclined, concerning for aspiration.  Upon entering room, clinician elevated the [facility] and pt provided with soda per her request.  Required hand-over-hand assist, but pt able to execute 50% of the effort and demonstrated improved toleration with no further coughing.  Palliative Medicine plan reviewed-pt will be transitioning to SNF with hospice and a comfort approach.  Recommend basic precautions when feeding to assist with comfort and safety - aspiration is a likelihood but can be minimized if pt is hand-fed carefully with attention to rate and bolus size; HOB elevated as high as possible is paramount to safety.  No further SLP f/u is warranted at this time - our services will sign off.    HPI HPI: Penny Delgado is a 80 y.o. female with past medical history of hypertension, diabetes, dementia, failure to thrive, who presents to the emergency department with failure to thrive. Per nursing home facility patient has been refusing to eat or drink, refusing to take medications. Unclear baseline.Pt has UTI.       SLP Plan  All goals met     Recommendations  Diet recommendations: Dysphagia 1 (puree);Thin liquid Liquids provided via: Cup;Straw Medication Administration: Crushed with puree Supervision: Staff to assist with self feeding              Oral Care Recommendations: Oral care BID Follow up Recommendations: Skilled Nursing facility Plan: All goals met   Juan Quam Laurice 02/09/2015, 10:35 AM

## 2015-02-09 NOTE — NC FL2 (Signed)
[place] LEVEL OF CARE SCREENING TOOL     IDENTIFICATION  Patient Name: Penny Delgado Birthdate: 28-Mar-1934 Sex: female Admission Date (Current Location): 02/07/2015  [place] Number: [facility] and Address:  [facility],  [place] [address], [place]      Provider Number: [id number]  Attending Physician Name and Address:  Domenic Polite, MD  Relative Name and Phone Number:       Current Level of Care: Hospital Recommended Level of Care: [place] Prior Approval Number:    Date Approved/Denied:   PASRR Number: [id number]  Discharge Plan: SNF    Current Diagnoses: Patient Active Problem List   Diagnosis Date Noted  . Pressure ulcer 02/08/2015  . Chronic pain syndrome 02/08/2015  . Depression due to dementia 02/08/2015  . Acute renal failure (ARF) ([place]) 02/07/2015  . UTI (lower urinary tract infection) 02/07/2015  . Encephalopathy acute 02/07/2015  . Renal failure 02/07/2015  . Primary vulvar cancer ([place]) 05/14/2013  . Anemia due to blood loss, acute 04/05/2011  . AKI (acute kidney injury) ([place]) 04/04/2011  . Hypotension-? 2/2 to [facility] 04/04/2011  . CHF-Ef 60-65% on Echo 2004 04/04/2011  . Pulmonary HTN ([place]) 04/04/2011  . Osteoarthritis of right hip 04/03/2011  . Morbid obesity ([place]) 04/03/2011    Orientation RESPIRATION BLADDER Height & Weight    Self (disoriented x 4)  Normal Incontinent, Indwelling catheter 5\' 5"  (165.1 cm) 186 lbs.  BEHAVIORAL SYMPTOMS/MOOD NEUROLOGICAL BOWEL NUTRITION STATUS     (NONE) Incontinent Diet (Dysphagia 1 (puree);Thin liquid)  AMBULATORY STATUS COMMUNICATION OF NEEDS Skin   Total Care Non-Verbally PU Stage and Appropriate Care (Stage I PU to right;left heel and right; left sacrum) PU Stage 1 Dressing: Daily                     Personal Care Assistance Level of Assistance  Bathing, Feeding, Dressing Bathing Assistance: Maximum  assistance Feeding assistance: Limited assistance Dressing Assistance: Maximum assistance     Functional Limitations Info  Sight, Hearing, Speech Sight Info: Adequate Hearing Info: Adequate Speech Info: Impaired    SPECIAL CARE FACTORS FREQUENCY                       Contractures      Additional Factors Info  Code Status, Allergies Code Status Info: DNR code status Allergies Info: Mirtazapine, Nitrofurantoin           Current Medications (02/09/2015):  This is the current hospital active medication list Current Facility-Administered Medications  Medication Dose Route Frequency Provider Last Rate Last Dose  . acetaminophen (TYLENOL) tablet 650 mg  650 mg Oral Q6H PRN Belkys A Regalado, MD       Or  . acetaminophen (TYLENOL) suppository 650 mg  650 mg Rectal Q6H PRN Belkys A Regalado, MD      . albuterol (PROVENTIL) (2.5 MG/3ML) 0.083% nebulizer solution 3 mL  3 mL Nebulization QID PRN Belkys A Regalado, MD      . aspirin tablet 325 mg  325 mg Oral Daily Belkys A Regalado, MD   325 mg at 02/09/15 1017  . DULoxetine (CYMBALTA) DR capsule 20 mg  20 mg Oral Daily Acquanetta Chain, DO   20 mg at 02/09/15 1017  . OLANZapine (ZYPREXA) tablet 2.5 mg  2.5 mg Oral QHS Acquanetta Chain, DO   2.5 mg at 02/08/15 2159  . ondansetron (ZOFRAN) tablet 4 mg  4 mg Oral Q6H PRN Belkys A Regalado, MD       Or  . ondansetron (ZOFRAN) injection 4 mg  4 mg Intravenous Q6H PRN Belkys A Regalado, MD      . oxyCODONE (ROXICODONE INTENSOL) 20 MG/ML concentrated solution 10 mg  10 mg Oral Q2H PRN Acquanetta Chain, DO      . oxyCODONE-acetaminophen (PERCOCET/ROXICET) 5-325 MG per tablet 1 tablet  1 tablet Oral TID Acquanetta Chain, DO   1 tablet at 02/09/15 1017  . piperacillin-tazobactam (ZOSYN) IVPB 2.25 g  2.25 g Intravenous Q8H Donald Prose Runyon, RPH   2.25 g at 02/09/15 0746  . senna-docusate (Senokot-S) tablet 2 tablet  2 tablet Oral QHS Acquanetta Chain, DO   2 tablet at  02/08/15 2159  . sodium bicarbonate 150 mEq in dextrose 5 % 1,000 mL infusion   Intravenous Continuous Belkys A Regalado, MD 100 mL/hr at 02/08/15 2120       Discharge Medications: Please see discharge summary for a list of discharge medications.  Relevant Imaging Results:  Relevant Lab Results:   Additional Information SSN: [id number]. Pt to have Hospice follow at SNF.  Frazier Balfour A, LCSW

## 2015-02-09 NOTE — NC FL2 (Signed)
[place] LEVEL OF CARE SCREENING TOOL     IDENTIFICATION  Patient Name: Penny Delgado Birthdate: [birth year] Sex: female Admission Date (Current Location): 02/07/2015  [facility] and [place] Number:  ([place])   Facility and Address:  [facility],  [place] [address], [place]      Provider Number: [id number]  Attending Physician Name and Address:  Domenic Polite, MD  Relative Name and Phone Number:       Current Level of Care: Hospital Recommended Level of Care: [place] Prior Approval Number:    Date Approved/Denied:   PASRR Number: [id number]  Discharge Plan: SNF    Current Diagnoses: Patient Active Problem List   Diagnosis Date Noted  . Pressure ulcer 02/08/2015  . Chronic pain syndrome 02/08/2015  . Depression due to dementia 02/08/2015  . Acute renal failure (ARF) ([place]) 02/07/2015  . UTI (lower urinary tract infection) 02/07/2015  . Encephalopathy acute 02/07/2015  . Renal failure 02/07/2015  . Primary vulvar cancer ([place]) 05/14/2013  . Anemia due to blood loss, acute 04/05/2011  . AKI (acute kidney injury) ([place]) 04/04/2011  . Hypotension-? 2/2 to [facility] 04/04/2011  . CHF-Ef 60-65% on Echo 2004 04/04/2011  . Pulmonary HTN ([place]) 04/04/2011  . Osteoarthritis of right hip 04/03/2011  . Morbid obesity ([place]) 04/03/2011    Orientation RESPIRATION BLADDER Height & Weight    Self  Normal   5\' 5"  (165.1 cm) 186 lbs.  BEHAVIORAL SYMPTOMS/MOOD NEUROLOGICAL BOWEL NUTRITION STATUS        Diet (DYS1 fluid consistency thin)  AMBULATORY STATUS COMMUNICATION OF NEEDS Skin     Verbally Surgical wounds, PU Stage and Appropriate Care (heel) PU Stage 1 Dressing: BID                     Personal Care Assistance Level of Assistance              Functional Limitations Info             SPECIAL CARE FACTORS FREQUENCY                       Contractures      Additional Factors Info  Allergies,  Code Status Code Status Info: DNR Allergies Info: Mirtazapine, Nitrofurantoin           Current Medications (02/09/2015):  This is the current hospital active medication list Current Facility-Administered Medications  Medication Dose Route Frequency Provider Last Rate Last Dose  . acetaminophen (TYLENOL) tablet 650 mg  650 mg Oral Q6H PRN Belkys A Regalado, MD       Or  . acetaminophen (TYLENOL) suppository 650 mg  650 mg Rectal Q6H PRN Belkys A Regalado, MD      . albuterol (PROVENTIL) (2.5 MG/3ML) 0.083% nebulizer solution 3 mL  3 mL Nebulization QID PRN Belkys A Regalado, MD      . aspirin tablet 325 mg  325 mg Oral Daily Belkys A Regalado, MD   325 mg at 02/08/15 0849  . DULoxetine (CYMBALTA) DR capsule 20 mg  20 mg Oral Daily Acquanetta Chain, DO   20 mg at 02/08/15 1624  . OLANZapine (ZYPREXA) tablet 2.5 mg  2.5 mg Oral QHS Acquanetta Chain, DO   2.5 mg at 02/08/15 2159  . ondansetron (ZOFRAN) tablet 4 mg  4 mg Oral Q6H PRN Belkys A Regalado, MD       Or  . ondansetron (ZOFRAN) injection 4  mg  4 mg Intravenous Q6H PRN Belkys A Regalado, MD      . oxyCODONE (ROXICODONE INTENSOL) 20 MG/ML concentrated solution 10 mg  10 mg Oral Q2H PRN Acquanetta Chain, DO      . oxyCODONE-acetaminophen (PERCOCET/ROXICET) 5-325 MG per tablet 1 tablet  1 tablet Oral TID Acquanetta Chain, DO   1 tablet at 02/08/15 2159  . piperacillin-tazobactam (ZOSYN) IVPB 2.25 g  2.25 g Intravenous Q8H Donald Prose Runyon, RPH   2.25 g at 02/09/15 0746  . senna-docusate (Senokot-S) tablet 2 tablet  2 tablet Oral QHS Acquanetta Chain, DO   2 tablet at 02/08/15 2159  . sodium bicarbonate 150 mEq in dextrose 5 % 1,000 mL infusion   Intravenous Continuous Belkys A Regalado, MD 100 mL/hr at 02/08/15 2120       Discharge Medications: Please see discharge summary for a list of discharge medications.  Relevant Imaging Results:  Relevant Lab Results:   Additional Information    Carlean Jews,  LCSW

## 2015-02-09 NOTE — Progress Notes (Signed)
[facility] PROGRESS NOTE  Penny Delgado [id number] DOB: [birth year] DOA: 02/07/2015 PCP: Woody Seller, MD  Assessment/Plan: Penny Delgado is a 80 y.o. female with past medical history of hypertension, diabetes, dementia, failure to thrive, who presents to the emergency department with failure to thrive. Per nursing home facility patient has been refusing to eat or drink, refusing to take medications. Unclear baseline.   Evaluation in the emergency department; creatinine 6.0, BUN; 132, lactic acid at 0.75, UA with too numerous to count white blood cell. CT head no acute intracranial abnormality. Chest x-ray no acute abnormality. Patient was found to have systolic blood pressure in the 80s, which responded to IV fluids.   1-Acute encephalopathy; Patient with history of dementia.  Suspect secondary to renal failure, UTI.  Mild improvement only and fluctuates  2-Acute Renal Failure: Metabolic acidosis.  In setting of hypovolemia, ACE, poor oral intake hypotension.  Now on bicarb Gtt due to metabolic acidosis.  Creatinine improving 3.8 today from 6 on admission  3-Sepsis; Patient with hypotension, renal failure, source UTI.  -continue IVF, ZOsyn, Vanc pending culture data -Chest x ray negative for PNA.  -FU blood and Urine Cx  4-Dementia, FTT; Patient has been refusing medications and refuses to eat at facility.  Per son patient is bedbound. She has good and bad days. S/p palliative meeting yesterday, plan for SNF with Hospice If Bp stable will DC tomorrow to SNF  5-Anemia; last Hb per records 9--to 10/  -stable  Code Status: DNR Family Communication: none at bedside, s/p Palliative meeting 12/13 Disposition Plan: SNF with Hospice tomorrow if BP stable   Consultants:  Palliative care  Procedures:  none  Antibiotics:  Zosyn 12-12  HPI/Subjective: Confused, sleepy, no events, BP was in 70s overnight  Objective: Filed Vitals:   02/09/15 0540  02/09/15 1502  BP: 98/45 90/52  Pulse: 74 76  Temp: 97.5 F (36.4 C) 97.8 F (36.6 C)  Resp: 18 18    Intake/Output Summary (Last 24 hours) at 02/09/15 1513 Last data filed at 02/09/15 1500  Gross per 24 hour  Intake   1700 ml  Output   2100 ml  Net   -400 ml   Filed Weights   02/07/15 1357 02/07/15 1855 02/09/15 0540  Weight: 90.719 kg (200 lb) 83 kg (182 lb 15.7 oz) 84.6 kg (186 lb 8.2 oz)    Exam:   General:  Lethargic, somnolent, no distress  Cardiovascular: S 1, S 2 RRR  Respiratory: CTAB  Abdomen: BS present, soft, nt  Musculoskeletal: no edema  Data Reviewed: Basic Metabolic Panel:  Recent Labs Lab 02/07/15 1348 02/08/15 0515 02/09/15 0511  NA 138 141 145  K 5.1 4.0 3.0*  CL 104 117* 113*  CO2 17* 13* 21*  GLUCOSE 96 80 101*  BUN 132* 115* 84*  CREATININE 6.06* 4.87* 3.81*  CALCIUM 9.0 7.4* 7.8*   Liver Function Tests:  Recent Labs Lab 02/07/15 1348 02/08/15 0515  AST 14* 10*  ALT 12* 8*  ALKPHOS 79 53  BILITOT 0.6 0.8  PROT 8.2* 5.6*  ALBUMIN 3.3* 2.4*   No results for input(s): LIPASE, AMYLASE in the last 168 hours. No results for input(s): AMMONIA in the last 168 hours. CBC:  Recent Labs Lab 02/07/15 1348 02/08/15 0515 02/09/15 0511  WBC 7.6 6.4 4.0  NEUTROABS 5.5  --   --   HGB 11.8* 8.5* 9.1*  HCT 36.3 27.0* 28.4*  MCV 92.8 93.1 91.3  PLT 215 145* 150  Cardiac Enzymes: No results for input(s): CKTOTAL, CKMB, CKMBINDEX, TROPONINI in the last 168 hours. BNP (last 3 results) No results for input(s): BNP in the last 8760 hours.  ProBNP (last 3 results) No results for input(s): PROBNP in the last 8760 hours.  CBG: No results for input(s): GLUCAP in the last 168 hours.  Recent Results (from the past 240 hour(s))  Blood Culture (routine x 2)     Status: None (Preliminary result)   Collection Time: 02/07/15  2:18 PM  Result Value Ref Range Status   Specimen Description BLOOD RIGHT ARM  Final   Special Requests  BOTTLES DRAWN AEROBIC AND ANAEROBIC 10CC  Final   Culture   Final    NO GROWTH 2 DAYS Performed at [facility]    Report Status PENDING  Incomplete  Blood Culture (routine x 2)     Status: None (Preliminary result)   Collection Time: 02/07/15  2:37 PM  Result Value Ref Range Status   Specimen Description BLOOD LEFT HAND  Final   Special Requests BOTTLES DRAWN AEROBIC ONLY 5ML  Final   Culture   Final    NO GROWTH 2 DAYS Performed at [facility]    Report Status PENDING  Incomplete  Urine culture     Status: None   Collection Time: 02/07/15  2:48 PM  Result Value Ref Range Status   Specimen Description URINE, CATHETERIZED  Final   Special Requests NONE  Final   Culture   Final    MULTIPLE SPECIES PRESENT, SUGGEST RECOLLECTION Performed at [facility]    Report Status 02/09/2015 FINAL  Final  MRSA PCR Screening     Status: None   Collection Time: 02/07/15  6:58 PM  Result Value Ref Range Status   MRSA by PCR NEGATIVE NEGATIVE Final    Comment:        The GeneXpert MRSA Assay (FDA approved for NASAL specimens only), is one component of a comprehensive MRSA colonization surveillance program. It is not intended to diagnose MRSA infection nor to guide or monitor treatment for MRSA infections.      Studies: Ct Head Wo Contrast  02/07/2015  CLINICAL DATA:  Failure to thrive.  Dementia. EXAM: CT HEAD WITHOUT CONTRAST TECHNIQUE: Contiguous axial images were obtained from the base of the skull through the vertex without intravenous contrast. COMPARISON:  CT head 08/15/2010 FINDINGS: Mild atrophy. Negative for hydrocephalus. Mild chronic microvascular ischemic change in the white matter. Negative for acute infarct.  Negative for hemorrhage or mass. Negative calvarium. IMPRESSION: Mild atrophy and chronic microvascular ischemia. No acute abnormality Electronically Signed   By: Franchot Gallo M.D.   On: 02/07/2015 16:06   Dg Chest Port 1 View  02/07/2015   CLINICAL DATA:  Altered mental status. EXAM: PORTABLE CHEST 1 VIEW COMPARISON:  08/15/2010 FINDINGS: The heart size and pulmonary vascularity are normal and the lungs are clear. Severe chronic arthritic changes of both shoulders. No acute osseous abnormality. Extensive calcification in the arch of the aorta. IMPRESSION: No acute abnormalities. Aortic atherosclerosis. Chronic severe arthritis of both shoulders. Electronically Signed   By: Lorriane Shire M.D.   On: 02/07/2015 15:58    Scheduled Meds: . aspirin  325 mg Oral Daily  . DULoxetine  20 mg Oral Daily  . OLANZapine  2.5 mg Oral QHS  . oxyCODONE-acetaminophen  1 tablet Oral TID  . piperacillin-tazobactam (ZOSYN)  IV  2.25 g Intravenous Q8H  . senna-docusate  2 tablet Oral QHS  Continuous Infusions: .  sodium bicarbonate  infusion 1000 mL 100 mL/hr at 02/08/15 2120    Active Problems:   Pulmonary HTN ([place])   Acute renal failure (ARF) (HCC)   UTI (lower urinary tract infection)   Encephalopathy acute   Renal failure   Pressure ulcer   Chronic pain syndrome   Depression due to dementia    Time spent: 35 minutes.     [facility]  [facility] Pager [phone]. If 7PM-7AM, please contact night-coverage at [email or web address], password [facility] 02/09/2015, 3:13 PM  LOS: 2 days

## 2015-02-10 DIAGNOSIS — G894 Chronic pain syndrome: Secondary | ICD-10-CM

## 2015-02-10 LAB — CBC
HEMATOCRIT: 28.5 % — AB (ref 36.0–46.0)
HEMOGLOBIN: 9.3 g/dL — AB (ref 12.0–15.0)
MCH: 30.3 pg (ref 26.0–34.0)
MCHC: 32.6 g/dL (ref 30.0–36.0)
MCV: 92.8 fL (ref 78.0–100.0)
Platelets: 162 10*3/uL (ref 150–400)
RBC: 3.07 MIL/uL — AB (ref 3.87–5.11)
RDW: 14.8 % (ref 11.5–15.5)
WBC: 4.7 10*3/uL (ref 4.0–10.5)

## 2015-02-10 LAB — BASIC METABOLIC PANEL
Anion gap: 12 (ref 5–15)
BUN: 66 mg/dL — AB (ref 6–20)
CHLORIDE: 109 mmol/L (ref 101–111)
CO2: 27 mmol/L (ref 22–32)
CREATININE: 3.13 mg/dL — AB (ref 0.44–1.00)
Calcium: 7.9 mg/dL — ABNORMAL LOW (ref 8.9–10.3)
GFR calc Af Amer: 15 mL/min — ABNORMAL LOW (ref >60)
GFR calc non Af Amer: 13 mL/min — ABNORMAL LOW (ref >60)
GLUCOSE: 100 mg/dL — AB (ref 65–99)
POTASSIUM: 2.9 mmol/L — AB (ref 3.5–5.1)
Sodium: 148 mmol/L — ABNORMAL HIGH (ref 135–145)

## 2015-02-10 MED ORDER — PRO-STAT SUGAR FREE PO LIQD: 30.0000 mL | Freq: Two times a day (BID) | ORAL | Status: DC

## 2015-02-10 MED ORDER — POTASSIUM CHLORIDE CRYS ER 20 MEQ PO TBCR: 40.0000 meq | EXTENDED_RELEASE_TABLET | ORAL | Status: DC

## 2015-02-10 MED ORDER — CIPROFLOXACIN HCL 250 MG PO TABS: 250.0000 mg | ORAL_TABLET | Freq: Two times a day (BID) | ORAL | Status: AC

## 2015-02-10 MED ORDER — SENNOSIDES 8.6 MG PO TABS: 1.0000 | ORAL_TABLET | Freq: Two times a day (BID) | ORAL | Status: AC

## 2015-02-10 MED ORDER — OXYCODONE-ACETAMINOPHEN 5-325 MG PO TABS: 1.0000 | ORAL_TABLET | Freq: Three times a day (TID) | ORAL | Status: AC

## 2015-02-10 MED ORDER — TRAMADOL HCL 50 MG PO TABS: 50.0000 mg | ORAL_TABLET | Freq: Four times a day (QID) | ORAL | Status: AC | PRN

## 2015-02-10 NOTE — Progress Notes (Signed)
Pt for discharge to [facility].   CSW facilitated pt discharge needs including contacting facility, faxing pt discharge information via [place], notifying pt at bedside and notifying pt husband via telephone, providing RN phone number to call report, and arranging ambulance transport via [facility] for pt to [facility].   CSW confirmed with [facility] that facility will arrange hospice for pt at facility.  Pt husband pleased that pt returning today as pt birthday is today.  No further social work needs identified at this time.  CSW signing off.   Alison Murray, MSW, [place] [employer] [phone]

## 2015-02-10 NOTE — Discharge Summary (Signed)
Physician Discharge Summary  Penny Delgado [id number] DOB: April 08, 1934 DOA: 02/07/2015  PCP: Woody Seller, MD  Admit date: 02/07/2015 Discharge date: 02/10/2015  Time spent: 45 minutes  Recommendations for Outpatient Follow-up:  1. Hospice at SNF for End of Life care   Discharge Diagnoses:    Sepsis   AKi on CKD 3   Advanced Dementia   Adult Failure to thrive   Pulmonary HTN (HCC)   Acute renal failure (ARF) (HCC)   Chronic kidney disease stage 3   UTI (lower urinary tract infection)   Encephalopathy acute   Renal failure   Pressure ulcer   Chronic pain syndrome   Depression due to dementia   Discharge Condition:poor  Diet recommendation: comfort feeds  Filed Weights   02/07/15 1855 02/09/15 0540 02/10/15 0524  Weight: 83 kg (182 lb 15.7 oz) 84.6 kg (186 lb 8.2 oz) 84.6 kg (186 lb 8.2 oz)    History of present illness:  Chief Complaint: Altered mental status HPI: Penny Delgado is a 80 y.o. female with past medical history of hypertension, diabetes, dementia, failure to thrive, who presents to the emergency department with failure to thrive. Per nursing home facility patient has been refusing to eat or drink, refusing to take medications for weeks-months. Unclear baseline.  Evaluation in the emergency department; creatinine 6.0, BUN; 132, lactic acid at 0.75, UA with too numerous to count white blood cell. CT head no acute intracranial abnormality. Chest x-ray no acute abnormality. Patient was found to have systolic blood pressure in the 80s  Hospital Course:  1-Acute encephalopathy; Patient with history of baseline advanced dementia.  Suspect secondary to renal failure, UTI, sepsis  Mild improvement only and fluctuates significantly like at baseline  2-Acute Renal Failure: Metabolic acidosis.  In setting of hypovolemia, ACE, poor oral intake hypotension.  Was on on bicarb Gtt due to metabolic acidosis.  Creatinine improved some to 3.1, seen by  Renal and recommended conservative management only given overall poor prognosis  3-Sepsis; Patient with hypotension, renal failure, source likely UTI.  -Treated with IVF, ZOsyn, Vanc  -Chest x ray negative for Pneumonia.  -Blood and Urine Cxnegative thus far -will be changed to Cipro for few more days upon discharge to SNF  4-Dementia, FTT; End of life -Severely debilitated, with Dementia, Patient has been refusing medications and meals at facility and in hospital S/p palliative meeting with Dr/Golding 12/13, due to advanced Dementia, very poor Po intake, debilitated status, decision made for Hospice. She is a longterm resident at SNF for over 4years and will get Hospice/End of life care at SNF  5-Anemia; last Hb per records 9--to 10/  -stable  Code Status: DNR  Consultations:  Palliative care  Renal  Discharge Exam: Filed Vitals:   02/09/15 2111 02/10/15 0524  BP: 89/31 88/53  Pulse: 82 79  Temp: 98.6 F (37 C) 98.4 F (36.9 C)  Resp: 18 18    General: obtunded Cardiovascular: S1S2/RRR Respiratory: poor air movement  Discharge Instructions   Discharge Instructions    Discharge instructions    Complete by:  As directed   Comfort feeds     Increase activity slowly    Complete by:  As directed           Current Discharge Medication List    START taking these medications   Details  ciprofloxacin (CIPRO) 250 MG tablet Take 1 tablet (250 mg total) by mouth 2 (two) times daily. For 2days    oxyCODONE-acetaminophen (PERCOCET/ROXICET) 5-325 MG  tablet Take 1 tablet by mouth 3 (three) times daily. Qty: 30 tablet, Refills: 0      CONTINUE these medications which have CHANGED   Details  senna (SENOKOT) 8.6 MG tablet Take 1 tablet (8.6 mg total) by mouth 2 (two) times daily.      CONTINUE these medications which have NOT CHANGED   Details  acetaminophen (TYLENOL) 500 MG tablet Take 500 mg by mouth every 6 (six) hours as needed for moderate pain.     albuterol  (PROVENTIL) (2.5 MG/3ML) 0.083% nebulizer solution Take 3 mLs by nebulization 4 (four) times daily as needed. Shortness of breathe.    aspirin 325 MG tablet Take 325 mg by mouth daily.    Melatonin 3 MG CAPS Take 6 mg by mouth at bedtime.    sertraline (ZOLOFT) 50 MG tablet Take 50 mg by mouth daily.    traMADol (ULTRAM) 50 MG tablet Take 50 mg by mouth every 6 (six) hours as needed for moderate pain.     Cranberry 475 MG CAPS Take by mouth daily.       STOP taking these medications     Cranberry 400 MG TABS      docusate sodium (STOOL SOFTENER) 100 MG capsule      meclizine (ANTIVERT) 12.5 MG tablet      olmesartan (BENICAR) 40 MG tablet      PREMARIN vaginal cream      sennosides-docusate sodium (SENOKOT-S) 8.6-50 MG tablet        Allergies  Allergen Reactions  . Mirtazapine     Per MAR  . Nitrofurantoin     unknown   Follow-up Information    Follow up with Hospice at [facility].   Specialty:  Hospice and Palliative Medicine   Why:  in 1 week for Hospice/End of life care   Contact information:   [place] [address] [phone]        The results of significant diagnostics from this hospitalization (including imaging, microbiology, ancillary and laboratory) are listed below for reference.    Significant Diagnostic Studies: Ct Head Wo Contrast  02/07/2015  CLINICAL DATA:  Failure to thrive.  Dementia. EXAM: CT HEAD WITHOUT CONTRAST TECHNIQUE: Contiguous axial images were obtained from the base of the skull through the vertex without intravenous contrast. COMPARISON:  CT head 08/15/2010 FINDINGS: Mild atrophy. Negative for hydrocephalus. Mild chronic microvascular ischemic change in the white matter. Negative for acute infarct.  Negative for hemorrhage or mass. Negative calvarium. IMPRESSION: Mild atrophy and chronic microvascular ischemia. No acute abnormality Electronically Signed   By: Franchot Gallo M.D.   On: 02/07/2015 16:06   Dg  Chest Port 1 View  02/07/2015  CLINICAL DATA:  Altered mental status. EXAM: PORTABLE CHEST 1 VIEW COMPARISON:  08/15/2010 FINDINGS: The heart size and pulmonary vascularity are normal and the lungs are clear. Severe chronic arthritic changes of both shoulders. No acute osseous abnormality. Extensive calcification in the arch of the aorta. IMPRESSION: No acute abnormalities. Aortic atherosclerosis. Chronic severe arthritis of both shoulders. Electronically Signed   By: Lorriane Shire M.D.   On: 02/07/2015 15:58    Microbiology: Recent Results (from the past 240 hour(s))  Blood Culture (routine x 2)     Status: None (Preliminary result)   Collection Time: 02/07/15  2:18 PM  Result Value Ref Range Status   Specimen Description BLOOD RIGHT ARM  Final   Special Requests BOTTLES DRAWN AEROBIC AND ANAEROBIC 10CC  Final   Culture  Final    NO GROWTH 2 DAYS Performed at [facility]    Report Status PENDING  Incomplete  Blood Culture (routine x 2)     Status: None (Preliminary result)   Collection Time: 02/07/15  2:37 PM  Result Value Ref Range Status   Specimen Description BLOOD LEFT HAND  Final   Special Requests BOTTLES DRAWN AEROBIC ONLY 5ML  Final   Culture   Final    NO GROWTH 2 DAYS Performed at [facility]    Report Status PENDING  Incomplete  Urine culture     Status: None   Collection Time: 02/07/15  2:48 PM  Result Value Ref Range Status   Specimen Description URINE, CATHETERIZED  Final   Special Requests NONE  Final   Culture   Final    MULTIPLE SPECIES PRESENT, SUGGEST RECOLLECTION Performed at [facility]    Report Status 02/09/2015 FINAL  Final  MRSA PCR Screening     Status: None   Collection Time: 02/07/15  6:58 PM  Result Value Ref Range Status   MRSA by PCR NEGATIVE NEGATIVE Final    Comment:        The GeneXpert MRSA Assay (FDA approved for NASAL specimens only), is one component of a comprehensive MRSA colonization surveillance  program. It is not intended to diagnose MRSA infection nor to guide or monitor treatment for MRSA infections.      Labs: Basic Metabolic Panel:  Recent Labs Lab 02/07/15 1348 02/08/15 0515 02/09/15 0511 02/10/15 0518  NA 138 141 145 148*  K 5.1 4.0 3.0* 2.9*  CL 104 117* 113* 109  CO2 17* 13* 21* 27  GLUCOSE 96 80 101* 100*  BUN 132* 115* 84* 66*  CREATININE 6.06* 4.87* 3.81* 3.13*  CALCIUM 9.0 7.4* 7.8* 7.9*   Liver Function Tests:  Recent Labs Lab 02/07/15 1348 02/08/15 0515  AST 14* 10*  ALT 12* 8*  ALKPHOS 79 53  BILITOT 0.6 0.8  PROT 8.2* 5.6*  ALBUMIN 3.3* 2.4*   No results for input(s): LIPASE, AMYLASE in the last 168 hours. No results for input(s): AMMONIA in the last 168 hours. CBC:  Recent Labs Lab 02/07/15 1348 02/08/15 0515 02/09/15 0511 02/10/15 0518  WBC 7.6 6.4 4.0 4.7  NEUTROABS 5.5  --   --   --   HGB 11.8* 8.5* 9.1* 9.3*  HCT 36.3 27.0* 28.4* 28.5*  MCV 92.8 93.1 91.3 92.8  PLT 215 145* 150 162   Cardiac Enzymes: No results for input(s): CKTOTAL, CKMB, CKMBINDEX, TROPONINI in the last 168 hours. BNP: BNP (last 3 results) No results for input(s): BNP in the last 8760 hours.  ProBNP (last 3 results) No results for input(s): PROBNP in the last 8760 hours.  CBG: No results for input(s): GLUCAP in the last 168 hours.     SignedDomenic Polite  [facility] 02/10/2015, 10:14 AM

## 2015-02-10 NOTE — Clinical Social Work Note (Addendum)
Clinical Social Work Assessment  Patient Details  Name: Penny Delgado MRN: [id number] Date of Birth: 25-May-1934  Date of referral:  02/09/15               Reason for consult:  Discharge Planning                Permission sought to share information with:  Family Supports Permission granted to share information::  Yes, Verbal Permission Granted  Name::     Lexey Fletes  Agency::     Relationship::  husband  Contact Information:  [phone]  Housing/Transportation Living arrangements for the past 2 months:  [place] of Information:  Spouse Patient Interpreter Needed:  None Criminal Activity/Legal Involvement Pertinent to Current Situation/Hospitalization:  No - Comment as needed Significant Relationships:  Spouse Lives with:  Facility Resident Do you feel safe going back to the place where you live?  Yes Need for family participation in patient care:  Yes (Comment)  Care giving concerns:  Pt admitted from [facility] where pt is a long term resident. Pt husband does not identify any caregiving concerns at SNF.    Social Worker assessment / plan:  CSW received referral that pt admitted from [facility]. PMT MD met with pt and recommendation for pt to return to [facility] with hospice.    CSW visited pt room and no family present at bedside. Per chart review, pt is disoriented x 4 and pt sleeping when CSW visited pt room. CSW contacted pt husband, Wilber Oliphant via telephone. CSW introduced self and explained role. Pt husband confirmed that pt resident at [facility]. Pt husband stated that pt is a long term resident at facility. CSW inquired with pt husband about hospice following at [facility] and pt husband confirmed that pt husband and pt son made decision for hospice to follow at [facility]. CSW discussed with pt husband that CSW will notify [facility] SNF in order for facility to initiate hospice upon pt return. Pt husband  agreeable.   CSW completed FL2 and sent clinical information to [facility]. CSW spoke with [facility] who confirmed pt is Medicaid long term at facility and can have full hospice services upon return. [facility] [employer] stated that facility will set up hospice upon pt return.   CSW to continue to follow and assist with pt return to [facility] when medically ready for discharge.   Employment status:  Retired Forensic scientist:  Information systems manager, Medicaid In [place] PT Recommendations:  Not assessed at this time [place] / Referral to community resources:  [place]  Patient/Family's Response to care:  Pt disoriented x 4 and unable to participate in assessment. Pt husband eager for pt to get back to [facility] and hopeful that pt can discharge tomorrow because it will be pt birthday.  Patient/Family's Understanding of and Emotional Response to Diagnosis, Current Treatment, and Prognosis:  Pt husband displayed understanding surrounding pt diagnosis and prognosis. Pt husband coping appropriately with plans for pt to have hospice at [facility].   Emotional Assessment Appearance:  Appears stated age Attitude/Demeanor/Rapport:  Unable to Assess Affect (typically observed):  Unable to Assess Orientation:   (disoriented x 4) Alcohol / Substance use:  Not Applicable Psych involvement (Current and /or in the community):  No (Comment)  Discharge Needs  Concerns to be addressed:  Discharge Planning Concerns Readmission within the last 30 days:  No Current discharge risk:  Chronically ill Barriers to Discharge:  Continued Medical Work up   Ladell Pier, LCSW 02/10/2015, 9:31 AM  [phone]

## 2015-02-10 NOTE — Care Management Important Message (Signed)
Important Message  Patient Details  Name: Penny Delgado MRN: [id number] Date of Birth: [birth year]   Medicare Important Message Given:  Yes    Camillo Flaming 02/10/2015, 12:13 [place] Message  Patient Details  Name: Penny Delgado MRN: [id number] Date of Birth: Dec 01, 1934   Medicare Important Message Given:  Yes    Camillo Flaming 02/10/2015, 12:13 PM

## 2015-02-12 LAB — CULTURE, BLOOD (ROUTINE X 2)
CULTURE: NO GROWTH
Culture: NO GROWTH

## 2015-04-27 DEATH — deceased
